# Patient Record
Sex: Female | Born: 1945 | Race: Black or African American | Hispanic: No | Smoking: Former smoker
Health system: Southern US, Community
[De-identification: ages and names within clinical notes are randomized; demographics above are authoritative.]

## PROBLEM LIST (undated history)

## (undated) DIAGNOSIS — L0390 Cellulitis, unspecified: Secondary | ICD-10-CM

## (undated) DIAGNOSIS — I739 Peripheral vascular disease, unspecified: Secondary | ICD-10-CM

## (undated) DIAGNOSIS — IMO0002 Reserved for concepts with insufficient information to code with codable children: Secondary | ICD-10-CM

## (undated) DIAGNOSIS — I10 Essential (primary) hypertension: Secondary | ICD-10-CM

## (undated) HISTORY — DX: Reserved for concepts with insufficient information to code with codable children: IMO0002

## (undated) HISTORY — DX: Cellulitis, unspecified: L03.90

## (undated) HISTORY — DX: Peripheral vascular disease, unspecified: I73.9

## (undated) HISTORY — DX: Essential (primary) hypertension: I10

---

## 2012-01-21 ENCOUNTER — Ambulatory Visit: Payer: Self-pay | Admitting: Family Medicine

## 2012-02-01 ENCOUNTER — Other Ambulatory Visit: Payer: Self-pay | Admitting: Internal Medicine

## 2012-02-01 DIAGNOSIS — M79672 Pain in left foot: Secondary | ICD-10-CM

## 2012-02-01 DIAGNOSIS — M25572 Pain in left ankle and joints of left foot: Secondary | ICD-10-CM

## 2012-02-04 ENCOUNTER — Other Ambulatory Visit: Payer: Self-pay

## 2012-02-05 ENCOUNTER — Ambulatory Visit
Admission: RE | Admit: 2012-02-05 | Discharge: 2012-02-05 | Disposition: A | Discharge status: Home / Self Care | Payer: Medicare Other | Source: Ambulatory Visit | Attending: Internal Medicine | Admitting: Internal Medicine

## 2012-02-05 DIAGNOSIS — M79672 Pain in left foot: Secondary | ICD-10-CM

## 2012-02-05 DIAGNOSIS — M25572 Pain in left ankle and joints of left foot: Secondary | ICD-10-CM

## 2014-02-11 ENCOUNTER — Other Ambulatory Visit: Payer: Self-pay | Admitting: *Deleted

## 2014-02-11 DIAGNOSIS — I739 Peripheral vascular disease, unspecified: Secondary | ICD-10-CM

## 2014-02-11 DIAGNOSIS — L98499 Non-pressure chronic ulcer of skin of other sites with unspecified severity: Principal | ICD-10-CM

## 2014-02-13 ENCOUNTER — Encounter: Payer: Self-pay | Admitting: Vascular Surgery

## 2014-03-08 ENCOUNTER — Encounter: Payer: Medicare Other | Admitting: Vascular Surgery

## 2014-03-08 ENCOUNTER — Encounter (HOSPITAL_COMMUNITY): Payer: Medicare Other

## 2015-04-15 DIAGNOSIS — M2550 Pain in unspecified joint: Secondary | ICD-10-CM | POA: Diagnosis not present

## 2015-04-15 DIAGNOSIS — Z79899 Other long term (current) drug therapy: Secondary | ICD-10-CM | POA: Diagnosis not present

## 2015-04-15 DIAGNOSIS — M0589 Other rheumatoid arthritis with rheumatoid factor of multiple sites: Secondary | ICD-10-CM | POA: Diagnosis not present

## 2015-04-15 DIAGNOSIS — Z9229 Personal history of other drug therapy: Secondary | ICD-10-CM | POA: Diagnosis not present

## 2015-05-19 DIAGNOSIS — Z87891 Personal history of nicotine dependence: Secondary | ICD-10-CM | POA: Diagnosis not present

## 2015-05-19 DIAGNOSIS — I10 Essential (primary) hypertension: Secondary | ICD-10-CM | POA: Diagnosis not present

## 2015-10-06 DIAGNOSIS — I10 Essential (primary) hypertension: Secondary | ICD-10-CM | POA: Diagnosis not present

## 2016-01-13 DIAGNOSIS — I10 Essential (primary) hypertension: Secondary | ICD-10-CM | POA: Diagnosis not present

## 2016-01-13 DIAGNOSIS — Z720 Tobacco use: Secondary | ICD-10-CM | POA: Diagnosis not present

## 2016-05-31 DIAGNOSIS — Z79899 Other long term (current) drug therapy: Secondary | ICD-10-CM | POA: Diagnosis not present

## 2016-05-31 DIAGNOSIS — M0589 Other rheumatoid arthritis with rheumatoid factor of multiple sites: Secondary | ICD-10-CM | POA: Diagnosis not present

## 2016-06-07 DIAGNOSIS — Z79899 Other long term (current) drug therapy: Secondary | ICD-10-CM | POA: Diagnosis not present

## 2016-06-07 DIAGNOSIS — Z720 Tobacco use: Secondary | ICD-10-CM | POA: Diagnosis not present

## 2016-06-07 DIAGNOSIS — I10 Essential (primary) hypertension: Secondary | ICD-10-CM | POA: Diagnosis not present

## 2016-06-30 DIAGNOSIS — M50321 Other cervical disc degeneration at C4-C5 level: Secondary | ICD-10-CM | POA: Diagnosis not present

## 2016-06-30 DIAGNOSIS — M189 Osteoarthritis of first carpometacarpal joint, unspecified: Secondary | ICD-10-CM | POA: Diagnosis not present

## 2016-06-30 DIAGNOSIS — M0589 Other rheumatoid arthritis with rheumatoid factor of multiple sites: Secondary | ICD-10-CM | POA: Diagnosis not present

## 2016-06-30 DIAGNOSIS — M19042 Primary osteoarthritis, left hand: Secondary | ICD-10-CM | POA: Diagnosis not present

## 2016-06-30 DIAGNOSIS — Z79899 Other long term (current) drug therapy: Secondary | ICD-10-CM | POA: Diagnosis not present

## 2017-09-02 ENCOUNTER — Other Ambulatory Visit: Payer: Self-pay | Admitting: Nurse Practitioner

## 2017-09-02 DIAGNOSIS — Z1231 Encounter for screening mammogram for malignant neoplasm of breast: Secondary | ICD-10-CM

## 2017-10-05 ENCOUNTER — Ambulatory Visit
Admission: RE | Admit: 2017-10-05 | Discharge: 2017-10-05 | Disposition: A | Discharge status: Home / Self Care | Payer: Medicare Other | Source: Ambulatory Visit | Attending: Nurse Practitioner | Admitting: Nurse Practitioner

## 2017-10-05 DIAGNOSIS — Z1231 Encounter for screening mammogram for malignant neoplasm of breast: Secondary | ICD-10-CM

## 2018-07-04 DIAGNOSIS — M069 Rheumatoid arthritis, unspecified: Secondary | ICD-10-CM

## 2018-07-04 DIAGNOSIS — R7309 Other abnormal glucose: Secondary | ICD-10-CM

## 2018-07-04 DIAGNOSIS — I10 Essential (primary) hypertension: Secondary | ICD-10-CM

## 2018-07-04 DIAGNOSIS — E782 Mixed hyperlipidemia: Secondary | ICD-10-CM

## 2018-07-04 DIAGNOSIS — R011 Cardiac murmur, unspecified: Secondary | ICD-10-CM

## 2018-07-14 ENCOUNTER — Telehealth: Payer: Self-pay | Admitting: *Deleted

## 2018-07-14 ENCOUNTER — Encounter: Payer: Self-pay | Admitting: *Deleted

## 2018-07-14 NOTE — Telephone Encounter (Signed)
Fax Referral placed in Scheduling box Notes placed in Chart Prep file cabinet. [facility], Arnette Felts, FNP-BC P# [phone] F# [phone]

## 2018-07-25 ENCOUNTER — Other Ambulatory Visit: Payer: Self-pay | Admitting: Nurse Practitioner

## 2018-08-16 ENCOUNTER — Other Ambulatory Visit: Payer: Self-pay | Admitting: Nurse Practitioner

## 2018-08-28 ENCOUNTER — Other Ambulatory Visit: Payer: Self-pay | Admitting: Nurse Practitioner

## 2018-08-28 DIAGNOSIS — Z1231 Encounter for screening mammogram for malignant neoplasm of breast: Secondary | ICD-10-CM

## 2018-09-08 ENCOUNTER — Ambulatory Visit (INDEPENDENT_AMBULATORY_CARE_PROVIDER_SITE_OTHER): Payer: Medicare Other | Admitting: Nurse Practitioner

## 2018-09-08 ENCOUNTER — Encounter: Payer: Self-pay | Admitting: Nurse Practitioner

## 2018-09-08 VITALS — BP 132/80 | HR 73 | Temp 97.8°F | Ht 62.0 in | Wt 111.6 lb

## 2018-09-08 DIAGNOSIS — Z0000 Encounter for general adult medical examination without abnormal findings: Secondary | ICD-10-CM

## 2018-09-08 DIAGNOSIS — Z1159 Encounter for screening for other viral diseases: Secondary | ICD-10-CM | POA: Diagnosis not present

## 2018-09-08 DIAGNOSIS — I10 Essential (primary) hypertension: Secondary | ICD-10-CM

## 2018-09-08 DIAGNOSIS — H6122 Impacted cerumen, left ear: Secondary | ICD-10-CM

## 2018-09-08 LAB — POCT URINALYSIS DIPSTICK
Bilirubin, UA: NEGATIVE
GLUCOSE UA: NEGATIVE
Ketones, UA: NEGATIVE
Nitrite, UA: NEGATIVE
PROTEIN UA: NEGATIVE
Spec Grav, UA: 1.01 (ref 1.010–1.025)
Urobilinogen, UA: 0.2 E.U./dL
pH, UA: 5.5 (ref 5.0–8.0)

## 2018-09-08 LAB — POCT UA - MICROALBUMIN
Albumin/Creatinine Ratio, Urine, POC: 300
Creatinine, POC: 50 mg/dL
MICROALBUMIN (UR) POC: 10 mg/L

## 2018-09-08 NOTE — Patient Instructions (Signed)
  Pamela Mathews , Thank you for taking time to come for your Medicare Wellness Visit. I appreciate your ongoing commitment to your health goals. Please review the following plan we discussed and let me know if I can assist you in the future.   These are the goals we discussed: Goals    . DIET - REDUCE FAT INTAKE     Decrease her fried foods        This is a list of the screening recommended for you and due dates:  Health Maintenance  Topic Date Due  .  Hepatitis C: One time screening is recommended by Center for Disease Control  (CDC) for  adults born from [birth year] through 1965.   04/24/1946  . Colon Cancer Screening  01/15/1996  . DEXA scan (bone density measurement)  01/15/2011  . Flu Shot  07/19/2019*  . Tetanus Vaccine  09/09/2019*  . Pneumonia vaccines (1 of 2 - PCV13) 09/09/2019*  . Mammogram  10/06/2019  *Topic was postponed. The date shown is not the original due date.

## 2018-09-08 NOTE — Progress Notes (Addendum)
Subjective:     Patient ID: Pamela Mathews , female    DOB: [birth year] , 72 y.o.   MRN: [id number]   Chief Complaint  Patient presents with  . Annual Exam    HPI  Hypertension  This is a chronic problem. The current episode started more than 1 year ago. The problem is unchanged. Pertinent negatives include no anxiety. Past treatments include nothing.     Past Medical History:  Diagnosis Date  . Cellulitis   . Hypertension   . Peripheral arterial disease (HCC)   . Ulcer      Family History  Problem Relation Age of Onset  . Breast cancer Neg Hx      Current Outpatient Medications:  .  ergocalciferol (VITAMIN D2) 50000 UNITS capsule, Take 50,000 Units by mouth once a week., Disp: , Rfl:  .  folic acid (FOLVITE) 1 MG tablet, Take 1 mg by mouth daily., Disp: , Rfl:  .  hydrochlorothiazide (HYDRODIURIL) 12.5 MG tablet, TAKE 1 TABLET BY MOUTH EVERY DAY, Disp: 90 tablet, Rfl: 0 .  methotrexate (RHEUMATREX) 2.5 MG tablet, Take 2.5 mg by mouth once a week. Caution:Chemotherapy. Protect from light., Disp: , Rfl:  .  metoprolol succinate (TOPROL-XL) 25 MG 24 hr tablet, TAKE 1 TABLET(25 MG) BY MOUTH EVERY DAY, Disp: 90 tablet, Rfl: 0   No Known Allergies   Review of Systems  Constitutional: Negative.   HENT: Negative.   Eyes: Negative.   Respiratory: Negative.   Cardiovascular: Negative.   Gastrointestinal: Negative.   Endocrine: Negative.   Genitourinary: Negative.   Musculoskeletal: Negative.   Skin: Negative.   Allergic/Immunologic: Negative.   Neurological: Negative.   Hematological: Negative.   Psychiatric/Behavioral: Negative.      Today's Vitals   09/08/18 0936  BP: 132/80  Pulse: 73  Temp: 97.8 F (36.6 C)  TempSrc: Oral  SpO2: 91%  Weight: 111 lb 9.6 oz (50.6 kg)  Height: 5\' 2"  (1.575 m)  PainSc: 0-No pain   Body mass index is 20.41 kg/m.   Objective:  Physical Exam Vitals signs reviewed.  Constitutional:      Appearance: She is  well-developed and well-nourished.  HENT:     Head: Normocephalic and atraumatic.     Right Ear: External ear normal.     Left Ear: There is impacted cerumen.     Nose: Nose normal.     Mouth/Throat:     Mouth: Oropharynx is clear and moist.  Eyes:     Extraocular Movements: EOM normal.     Conjunctiva/sclera: Conjunctivae normal.     Pupils: Pupils are equal, round, and reactive to light.  Neck:     Musculoskeletal: Normal range of motion and neck supple.     Thyroid: No thyromegaly.  Cardiovascular:     Rate and Rhythm: Normal rate and regular rhythm.     Heart sounds: Normal heart sounds. No murmur.  Pulmonary:     Effort: Pulmonary effort is normal. No respiratory distress.     Breath sounds: Normal breath sounds. No wheezing.  Abdominal:     General: Bowel sounds are normal.     Palpations: Abdomen is soft.     Tenderness: There is no abdominal tenderness.  Musculoskeletal: Normal range of motion.  Skin:    General: Skin is warm and dry.     Capillary Refill: Capillary refill takes less than 2 seconds.  Neurological:     Mental Status: She is alert and oriented to person, place, and  time.  Psychiatric:        Mood and Affect: Mood and affect normal.         Assessment And Plan:     1. Medicare annual wellness visit, subsequent  Pt's annual wellness exam was performed and geriatric assessment reviewed.   Pt has no new identiafble wellness concerns at this time.   WIll obtain routine labs.   Will obtain UA and micro.   Behavior modifications discussed and diet history reviewed. Pt will continue to exercise regularly and modify diet, with low GI, plant based foods and decrease food intake of processed foods.   Recommend intake of daily multivitamin, Vitamin D, and calcium.  Recommond mammogram and colonoscopy for preventive screenings, as well as recommend immunizations that include influenza (up to date) and TDAP  2. Essential hypertension  Chronic,  controlled  Continue with current medications - EKG 12-Lead - POCT Urinalysis Dipstick (81002) - POCT UA - Microalbumin  3. Impacted cerumen of left ear  Removed cerumen with lighted curette without difficulty  4. Encounter for hepatitis C screening test for low risk patient       Pamela Felts, FNP    Medicare Annual Wellness    Objective:    Today's Vitals   09/08/18 0936  BP: 132/80  Pulse: 73  Temp: 97.8 F (36.6 C)  TempSrc: Oral  SpO2: 91%  Weight: 111 lb 9.6 oz (50.6 kg)  Height: 5\' 2"  (1.575 m)  PainSc: 0-No pain   Body mass index is 20.41 kg/m.  Advanced Directives 09/08/2018  Does Patient Have a Medical Advance Directive? No  Would patient like information on creating a medical advance directive? Yes (MAU/Ambulatory/Procedural Areas - Information given)    Current Medications (verified) Outpatient Encounter Medications as of 09/08/2018  Medication Sig  . ergocalciferol (VITAMIN D2) 50000 UNITS capsule Take 50,000 Units by mouth once a week.  . folic acid (FOLVITE) 1 MG tablet Take 1 mg by mouth daily.  . hydrochlorothiazide (HYDRODIURIL) 12.5 MG tablet TAKE 1 TABLET BY MOUTH EVERY DAY  . methotrexate (RHEUMATREX) 2.5 MG tablet Take 2.5 mg by mouth once a week. Caution:Chemotherapy. Protect from light.  . metoprolol succinate (TOPROL-XL) 25 MG 24 hr tablet TAKE 1 TABLET(25 MG) BY MOUTH EVERY DAY  . [DISCONTINUED] hydrochlorothiazide (MICROZIDE) 12.5 MG capsule Take 12.5 mg by mouth daily.   No facility-administered encounter medications on file as of 09/08/2018.     Allergies (verified) Patient has no known allergies.   History: Past Medical History:  Diagnosis Date  . Cellulitis   . Hypertension   . Peripheral arterial disease (HCC)   . Ulcer    No past surgical history on file. Family History  Problem Relation Age of Onset  . Breast cancer Neg Hx    Social History   Socioeconomic History  . Marital status: Divorced    Spouse name:  Not on file  . Number of children: Not on file  . Years of education: Not on file  . Highest education level: Not on file  Occupational History  . Not on file  Social Needs  . Financial resource strain: Not on file  . Food insecurity:    Worry: Not on file    Inability: Not on file  . Transportation needs:    Medical: Not on file    Non-medical: Not on file  Tobacco Use  . Smoking status: Former Smoker    Last attempt to quit: 08/2017    Years since quitting: 1.0  .  Smokeless tobacco: Never Used  Substance and Sexual Activity  . Alcohol use: Never    Frequency: Never  . Drug use: Never  . Sexual activity: Not on file  Lifestyle  . Physical activity:    Days per week: Not on file    Minutes per session: Not on file  . Stress: Not on file  Relationships  . Social connections:    Talks on phone: Not on file    Gets together: Not on file    Attends religious service: Not on file    Active member of club or organization: Not on file    Attends meetings of clubs or organizations: Not on file    Relationship status: Not on file  Other Topics Concern  . Not on file  Social History Narrative  . Not on file    Tobacco Counseling Counseling given: Not Answered   Clinical Intake:    Pain Score: 0-No pain                  Activities of Daily Living In your present state of health, do you have any difficulty performing the following activities: 09/08/2018  Hearing? N  Vision? N  Difficulty concentrating or making decisions? N  Walking or climbing stairs? N  Dressing or bathing? N  Doing errands, shopping? N  Some recent data might be hidden    Immunizations and Health Maintenance  There is no immunization history on file for this patient. Health Maintenance Due  Topic Date Due  . Hepatitis C Screening  August 24, 1946  . COLONOSCOPY  01/15/1996  . DEXA SCAN  01/15/2011    Patient Care Team: Pamela Felts, FNP as PCP - General (General  Practice)  Indicate any recent Medical Services you may have received from other than [facility] providers in the past year (date may be approximate).     Assessment:   This is a routine wellness examination for [place].  Hearing/Vision screen No exam data present  Dietary issues and exercise activities discussed:    Goals    . DIET - REDUCE FAT INTAKE     Decrease her fried foods       Depression Screen PHQ 2/9 Scores 09/08/2018  PHQ - 2 Score 0    Fall Risk Fall Risk  09/08/2018  Falls in the past year? 0    Is the patient's home free of loose throw rugs in walkways, pet beds, electrical cords, etc?   yes      Grab bars in the bathroom? no      Handrails on the stairs?   yes      Adequate lighting?   yes  Timed Get Up and Go Performed less than 2 seconds  Cognitive Function:        Screening Tests Health Maintenance  Topic Date Due  . Hepatitis C Screening  [birth year]  . COLONOSCOPY  01/15/1996  . DEXA SCAN  01/15/2011  . INFLUENZA VACCINE  07/19/2019 (Originally 05/18/2018)  . TETANUS/TDAP  09/09/2019 (Originally 01/14/1965)  . PNA vac Low Risk Adult (1 of 2 - PCV13) 09/09/2019 (Originally 01/15/2011)  . MAMMOGRAM  10/06/2019    Qualifies for Shingles Vaccine? declines  Cancer Screenings: Lung: Low Dose CT Chest recommended if Age 68-80 years, 30 pack-year currently smoking OR have quit w/in 15years. Patient does not qualify. Breast: Up to date on Mammogram? Yes   Up to date of Bone Density/Dexa? No Colorectal: will do cologuard  Additional Screenings:  Hepatitis C Screening:  done today     Plan:     Pt's annual wellness exam was performed and geriatric assessment reviewed.   Pt has no new identiafble wellness concerns at this time.   WIll obtain routine labs.   Behavior modifications discussed and diet history reviewed. Pt will continue to exercise regularly and modify diet, with low GI, plant based foods and decrease food intake of processed foods.    Recommend intake of daily multivitamin, Vitamin D, and calcium.  Recommond mammogram and colonoscopy for preventive screenings, as well as recommend immunizations that include influenza (up to date) an TDAP (declines)   I have personally reviewed and noted the following in the patient's chart:   . Medical and social history . Use of alcohol, tobacco or illicit drugs  . Current medications and supplements . Functional ability and status . Nutritional status . Physical activity . Advanced directives . List of other physicians . Hospitalizations, surgeries, and ER visits in previous 12 months . Vitals . Screenings to include cognitive, depression, and falls . Referrals and appointments  In addition, I have reviewed and discussed with patient certain preventive protocols, quality metrics, and best practice recommendations. A written personalized care plan for preventive services as well as general preventive health recommendations were provided to patient.     Pamela Felts, FNP   09/08/2018

## 2018-10-13 ENCOUNTER — Ambulatory Visit
Admission: RE | Admit: 2018-10-13 | Discharge: 2018-10-13 | Disposition: A | Discharge status: Home / Self Care | Payer: Medicare Other | Source: Ambulatory Visit | Attending: Nurse Practitioner | Admitting: Nurse Practitioner

## 2018-10-13 DIAGNOSIS — Z1231 Encounter for screening mammogram for malignant neoplasm of breast: Secondary | ICD-10-CM

## 2018-12-08 ENCOUNTER — Ambulatory Visit (INDEPENDENT_AMBULATORY_CARE_PROVIDER_SITE_OTHER): Payer: Medicare Other | Admitting: Nurse Practitioner

## 2018-12-08 ENCOUNTER — Encounter: Payer: Self-pay | Admitting: Nurse Practitioner

## 2018-12-08 ENCOUNTER — Other Ambulatory Visit: Payer: Self-pay

## 2018-12-08 VITALS — BP 140/78 | HR 62 | Temp 98.1°F | Ht 62.2 in | Wt 111.4 lb

## 2018-12-08 DIAGNOSIS — M06042 Rheumatoid arthritis without rheumatoid factor, left hand: Secondary | ICD-10-CM | POA: Insufficient documentation

## 2018-12-08 DIAGNOSIS — I10 Essential (primary) hypertension: Secondary | ICD-10-CM

## 2018-12-08 DIAGNOSIS — E782 Mixed hyperlipidemia: Secondary | ICD-10-CM | POA: Insufficient documentation

## 2018-12-08 DIAGNOSIS — R7303 Prediabetes: Secondary | ICD-10-CM | POA: Insufficient documentation

## 2018-12-08 DIAGNOSIS — Z1159 Encounter for screening for other viral diseases: Secondary | ICD-10-CM

## 2018-12-08 NOTE — Progress Notes (Signed)
Subjective:     Patient ID: Pamela Mathews , female    DOB: [birth year] , 73 y.o.   MRN: [id number]   Chief Complaint  Patient presents with  . Hypertension    HPI  Hypertension  This is a chronic problem. The current episode started more than 1 year ago. The problem is controlled. Pertinent negatives include no anxiety, chest pain, headaches, palpitations or shortness of breath. There are no associated agents to hypertension. The current treatment provides no improvement. There are no compliance problems.  There is no history of angina. There is no history of chronic renal disease.     Past Medical History:  Diagnosis Date  . Cellulitis   . Hypertension   . Peripheral arterial disease ([place])   . Ulcer      Family History  Problem Relation Age of Onset  . Hypertension Mother   . Breast cancer Neg Hx      Current Outpatient Medications:  .  ergocalciferol (VITAMIN D2) 50000 UNITS capsule, Take 50,000 Units by mouth once a week., Disp: , Rfl:  .  folic acid (FOLVITE) 1 MG tablet, Take 1 mg by mouth daily., Disp: , Rfl:  .  hydrochlorothiazide (HYDRODIURIL) 12.5 MG tablet, TAKE 1 TABLET BY MOUTH EVERY DAY, Disp: 90 tablet, Rfl: 0 .  methotrexate (RHEUMATREX) 2.5 MG tablet, Take 2.5 mg by mouth once a week. Caution:Chemotherapy. Protect from light., Disp: , Rfl:  .  metoprolol succinate (TOPROL-XL) 25 MG 24 hr tablet, TAKE 1 TABLET(25 MG) BY MOUTH EVERY DAY, Disp: 90 tablet, Rfl: 0   No Known Allergies   Review of Systems  Constitutional: Negative for fever.  Respiratory: Negative.  Negative for cough and shortness of breath.   Cardiovascular: Negative for chest pain, palpitations and leg swelling.  Endocrine: Negative for polydipsia, polyphagia and polyuria.  Musculoskeletal: Positive for arthralgias (left hand pain).  Skin: Negative.   Neurological: Negative for dizziness and headaches.     Today's Vitals   12/08/18 1016  BP: 140/78  Pulse: 62  Temp: 98.1 F  (36.7 C)  TempSrc: Oral  SpO2: 96%  Weight: 111 lb 6.4 oz (50.5 kg)  Height: 5' 2.2" (1.58 m)  PainSc: 8   PainLoc: Hand   Body mass index is 20.24 kg/m.   Objective:  Physical Exam Constitutional:      Appearance: Normal appearance.  Cardiovascular:     Rate and Rhythm: Normal rate and regular rhythm.     Pulses: Normal pulses.     Heart sounds: Normal heart sounds. No murmur.  Pulmonary:     Effort: Pulmonary effort is normal. No respiratory distress.     Breath sounds: Normal breath sounds. No wheezing.  Neurological:     Mental Status: She is alert.  Psychiatric:        Mood and Affect: Mood normal.         Assessment And Plan:     1. Essential hypertension . B/P is controlled.  . CMP ordered to check renal function.  . The importance of regular exercise and dietary modification was stressed to the patient.  . Stressed importance of losing ten percent of her body weight to help with B/P control.  . The weight loss would help with decreasing cardiac and cancer risk as well.  - BMP8+eGFR - Hemoglobin A1c  2. Mixed hyperlipidemia  Chronic, controlled  No current medications  Discussed the importance of avoiding fried and fatty foods. - Lipid panel  3. Prediabetes  Chronic, controlled  No current medications  Encouraged to limit intake of sugary foods and drinks  Encouraged to increase physical activity to 150 minutes per week - BMP8+eGFR - Hemoglobin A1c  4. 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        Janece Moore, FNP  

## 2018-12-09 LAB — BMP8+EGFR
BUN / CREAT RATIO: 21 (ref 12–28)
BUN: 14 mg/dL (ref 8–27)
CO2: 23 mmol/L (ref 20–29)
Calcium: 9.8 mg/dL (ref 8.7–10.3)
Chloride: 95 mmol/L — ABNORMAL LOW (ref 96–106)
Creatinine, Ser: 0.67 mg/dL (ref 0.57–1.00)
GFR calc Af Amer: 102 mL/min/{1.73_m2} (ref >59)
GFR calc non Af Amer: 88 mL/min/{1.73_m2} (ref >59)
Glucose: 80 mg/dL (ref 65–99)
Potassium: 4.3 mmol/L (ref 3.5–5.2)
Sodium: 136 mmol/L (ref 134–144)

## 2018-12-09 LAB — HEPATITIS C ANTIBODY: Hep C Virus Ab: <0.1 s/co ratio (ref 0.0–0.9)

## 2018-12-09 LAB — LIPID PANEL
Chol/HDL Ratio: 3.9 ratio (ref 0.0–4.4)
Cholesterol, Total: 231 mg/dL — ABNORMAL HIGH (ref 100–199)
HDL: 59 mg/dL (ref >39)
LDL Calculated: 156 mg/dL — ABNORMAL HIGH (ref 0–99)
Triglycerides: 82 mg/dL (ref 0–149)
VLDL CHOLESTEROL CAL: 16 mg/dL (ref 5–40)

## 2018-12-09 LAB — HEMOGLOBIN A1C
ESTIMATED AVERAGE GLUCOSE: 120 mg/dL
Hgb A1c MFr Bld: 5.8 % — ABNORMAL HIGH (ref 4.8–5.6)

## 2018-12-15 ENCOUNTER — Other Ambulatory Visit: Payer: Self-pay | Admitting: Nurse Practitioner

## 2019-02-02 ENCOUNTER — Other Ambulatory Visit: Payer: Self-pay | Admitting: Nurse Practitioner

## 2019-03-15 ENCOUNTER — Other Ambulatory Visit: Payer: Self-pay | Admitting: Nurse Practitioner

## 2019-05-02 ENCOUNTER — Other Ambulatory Visit: Payer: Self-pay | Admitting: Nurse Practitioner

## 2019-05-27 ENCOUNTER — Other Ambulatory Visit: Payer: Self-pay | Admitting: Nurse Practitioner

## 2019-06-08 ENCOUNTER — Ambulatory Visit: Payer: Medicare Other | Admitting: Nurse Practitioner

## 2019-06-12 ENCOUNTER — Ambulatory Visit: Payer: Medicare Other | Admitting: Nurse Practitioner

## 2019-06-13 ENCOUNTER — Ambulatory Visit: Payer: Medicare Other | Admitting: Nurse Practitioner

## 2019-06-13 ENCOUNTER — Ambulatory Visit: Payer: Medicare Other

## 2019-07-03 ENCOUNTER — Other Ambulatory Visit: Payer: Self-pay

## 2019-07-03 ENCOUNTER — Encounter: Payer: Self-pay | Admitting: Nurse Practitioner

## 2019-07-03 ENCOUNTER — Ambulatory Visit (INDEPENDENT_AMBULATORY_CARE_PROVIDER_SITE_OTHER): Payer: Medicare Other

## 2019-07-03 ENCOUNTER — Ambulatory Visit (INDEPENDENT_AMBULATORY_CARE_PROVIDER_SITE_OTHER): Payer: Medicare Other | Admitting: Nurse Practitioner

## 2019-07-03 VITALS — BP 132/90 | HR 78 | Temp 98.2°F | Ht 62.25 in | Wt 111.0 lb

## 2019-07-03 VITALS — BP 132/90 | HR 78 | Temp 98.2°F | Ht 62.2 in | Wt 111.6 lb

## 2019-07-03 DIAGNOSIS — I10 Essential (primary) hypertension: Secondary | ICD-10-CM | POA: Diagnosis not present

## 2019-07-03 DIAGNOSIS — Z0000 Encounter for general adult medical examination without abnormal findings: Secondary | ICD-10-CM | POA: Diagnosis not present

## 2019-07-03 DIAGNOSIS — E782 Mixed hyperlipidemia: Secondary | ICD-10-CM

## 2019-07-03 DIAGNOSIS — R7303 Prediabetes: Secondary | ICD-10-CM | POA: Diagnosis not present

## 2019-07-03 NOTE — Patient Instructions (Signed)
Pamela Mathews , Thank you for taking time to come for your Medicare Wellness Visit. I appreciate your ongoing commitment to your health goals. Please review the following plan we discussed and let me know if I can assist you in the future.   Screening recommendations/referrals: Colonoscopy: declines Mammogram: 09/2018 Bone Density: scheduled by other MD Recommended yearly ophthalmology/optometry visit for glaucoma screening and checkup Recommended yearly dental visit for hygiene and checkup  Vaccinations: Influenza vaccine: declines Pneumococcal vaccine: declines Tdap vaccine: declines Shingles vaccine: discussed    Advanced directives: Advance directive discussed with you today. Even though you declined this today please call our office should you change your mind and we can give you the proper paperwork for you to fill out.   Conditions/risks identified: HTN  Next appointment: 09/20/2019 at 9:30   Preventive Care 73 Years and Older, Female Preventive care refers to lifestyle choices and visits with your health care provider that can promote health and wellness. What does preventive care include?  A yearly physical exam. This is also called an annual well check.  Dental exams once or twice a year.  Routine eye exams. Ask your health care provider how often you should have your eyes checked.  Personal lifestyle choices, including:  Daily care of your teeth and gums.  Regular physical activity.  Eating a healthy diet.  Avoiding tobacco and drug use.  Limiting alcohol use.  Practicing safe sex.  Taking low-dose aspirin every day.  Taking vitamin and mineral supplements as recommended by your health care provider. What happens during an annual well check? The services and screenings done by your health care provider during your annual well check will depend on your age, overall health, lifestyle risk factors, and family history of disease. Counseling  Your health care  provider may ask you questions about your:  Alcohol use.  Tobacco use.  Drug use.  Emotional well-being.  Home and relationship well-being.  Sexual activity.  Eating habits.  History of falls.  Memory and ability to understand (cognition).  Work and work Statistician.  Reproductive health. Screening  You may have the following tests or measurements:  Height, weight, and BMI.  Blood pressure.  Lipid and cholesterol levels. These may be checked every 5 years, or more frequently if you are over 59 years old.  Skin check.  Lung cancer screening. You may have this screening every year starting at age 70 if you have a 30-pack-year history of smoking and currently smoke or have quit within the past 15 years.  Fecal occult blood test (FOBT) of the stool. You may have this test every year starting at age 72.  Flexible sigmoidoscopy or colonoscopy. You may have a sigmoidoscopy every 5 years or a colonoscopy every 10 years starting at age 25.  Hepatitis C blood test.  Hepatitis B blood test.  Sexually transmitted disease (STD) testing.  Diabetes screening. This is done by checking your blood sugar (glucose) after you have not eaten for a while (fasting). You may have this done every 1-3 years.  Bone density scan. This is done to screen for osteoporosis. You may have this done starting at age 30.  Mammogram. This may be done every 1-2 years. Talk to your health care provider about how often you should have regular mammograms. Talk with your health care provider about your test results, treatment options, and if necessary, the need for more tests. Vaccines  Your health care provider may recommend certain vaccines, such as:  Influenza vaccine. This  is recommended every year.  Tetanus, diphtheria, and acellular pertussis (Tdap, Td) vaccine. You may need a Td booster every 10 years.  Zoster vaccine. You may need this after age 10.  Pneumococcal 13-valent conjugate (PCV13)  vaccine. One dose is recommended after age 54.  Pneumococcal polysaccharide (PPSV23) vaccine. One dose is recommended after age 44. Talk to your health care provider about which screenings and vaccines you need and how often you need them. This information is not intended to replace advice given to you by your health care provider. Make sure you discuss any questions you have with your health care provider. Document Released: 10/31/2015 Document Revised: 06/23/2016 Document Reviewed: 08/05/2015 Elsevier Interactive Patient Education  2017 [place] Prevention in the Home Falls can cause injuries. They can happen to people of all ages. There are many things you can do to make your home safe and to help prevent falls. What can I do on the outside of my home?  Regularly fix the edges of walkways and driveways and fix any cracks.  Remove anything that might make you trip as you walk through a door, such as a raised step or threshold.  Trim any bushes or trees on the path to your home.  Use bright outdoor lighting.  Clear any walking paths of anything that might make someone trip, such as rocks or tools.  Regularly check to see if handrails are loose or broken. Make sure that both sides of any steps have handrails.  Any raised decks and porches should have guardrails on the edges.  Have any leaves, snow, or ice cleared regularly.  Use sand or salt on walking paths during winter.  Clean up any spills in your garage right away. This includes oil or grease spills. What can I do in the bathroom?  Use night lights.  Install grab bars by the toilet and in the tub and shower. Do not use towel bars as grab bars.  Use non-skid mats or decals in the tub or shower.  If you need to sit down in the shower, use a plastic, non-slip stool.  Keep the floor dry. Clean up any water that spills on the floor as soon as it happens.  Remove soap buildup in the tub or shower regularly.   Attach bath mats securely with double-sided non-slip rug tape.  Do not have throw rugs and other things on the floor that can make you trip. What can I do in the bedroom?  Use night lights.  Make sure that you have a light by your bed that is easy to reach.  Do not use any sheets or blankets that are too big for your bed. They should not hang down onto the floor.  Have a firm chair that has side arms. You can use this for support while you get dressed.  Do not have throw rugs and other things on the floor that can make you trip. What can I do in the kitchen?  Clean up any spills right away.  Avoid walking on wet floors.  Keep items that you use a lot in easy-to-reach places.  If you need to reach something above you, use a strong step stool that has a grab bar.  Keep electrical cords out of the way.  Do not use floor polish or wax that makes floors slippery. If you must use wax, use non-skid floor wax.  Do not have throw rugs and other things on the floor that can make you  trip. What can I do with my stairs?  Do not leave any items on the stairs.  Make sure that there are handrails on both sides of the stairs and use them. Fix handrails that are broken or loose. Make sure that handrails are as long as the stairways.  Check any carpeting to make sure that it is firmly attached to the stairs. Fix any carpet that is loose or worn.  Avoid having throw rugs at the top or bottom of the stairs. If you do have throw rugs, attach them to the floor with carpet tape.  Make sure that you have a light switch at the top of the stairs and the bottom of the stairs. If you do not have them, ask someone to add them for you. What else can I do to help prevent falls?  Wear shoes that:  Do not have high heels.  Have rubber bottoms.  Are comfortable and fit you well.  Are closed at the toe. Do not wear sandals.  If you use a stepladder:  Make sure that it is fully opened. Do not climb  a closed stepladder.  Make sure that both sides of the stepladder are locked into place.  Ask someone to hold it for you, if possible.  Clearly mark and make sure that you can see:  Any grab bars or handrails.  First and last steps.  Where the edge of each step is.  Use tools that help you move around (mobility aids) if they are needed. These include:  Canes.  Walkers.  Scooters.  Crutches.  Turn on the lights when you go into a dark area. Replace any light bulbs as soon as they burn out.  Set up your furniture so you have a clear path. Avoid moving your furniture around.  If any of your floors are uneven, fix them.  If there are any pets around you, be aware of where they are.  Review your medicines with your doctor. Some medicines can make you feel dizzy. This can increase your chance of falling. Ask your doctor what other things that you can do to help prevent falls. This information is not intended to replace advice given to you by your health care provider. Make sure you discuss any questions you have with your health care provider. Document Released: 07/31/2009 Document Revised: 03/11/2016 Document Reviewed: 11/08/2014 Elsevier Interactive Patient Education  2017 [employer].

## 2019-07-03 NOTE — Progress Notes (Signed)
Subjective:     Patient ID: Pamela Mathews , female    DOB: [birth year] , 73 y.o.   MRN: [id number]   Chief Complaint  Patient presents with  . Hypertension    HPI  She has been drinking coffee and has not taken her medications today. She also has not eaten.    Hypertension This is a chronic problem. The current episode started more than 1 year ago. The problem is unchanged. The problem is controlled. Pertinent negatives include no anxiety or headaches. There are no associated agents to hypertension. Risk factors for coronary artery disease include sedentary lifestyle. Past treatments include diuretics and beta blockers. There are no compliance problems.  There is no history of angina. There is no history of chronic renal disease.     Past Medical History:  Diagnosis Date  . Cellulitis   . Hypertension   . Peripheral arterial disease ([place])   . Ulcer      Family History  Problem Relation Age of Onset  . Hypertension Mother   . Breast cancer Neg Hx      Current Outpatient Medications:  .  folic acid (FOLVITE) 1 MG tablet, Take 1 mg by mouth daily., Disp: , Rfl:  .  hydrochlorothiazide (HYDRODIURIL) 12.5 MG tablet, TAKE 1 TABLET BY MOUTH EVERY DAY, Disp: 90 tablet, Rfl: 0 .  methotrexate (RHEUMATREX) 2.5 MG tablet, Take 2.5 mg by mouth once a week. Caution:Chemotherapy. Protect from light., Disp: , Rfl:  .  metoprolol succinate (TOPROL-XL) 25 MG 24 hr tablet, TAKE 1 TABLET(25 MG) BY MOUTH EVERY DAY, Disp: 90 tablet, Rfl: 0 .  VITAMIN D PO, Take 1 tablet by mouth daily., Disp: , Rfl:    No Known Allergies   Review of Systems  Constitutional: Negative.   Respiratory: Negative.   Cardiovascular: Negative.   Neurological: Negative for dizziness and headaches.     Today's Vitals   07/03/19 1035  BP: 132/90  Pulse: 78  Temp: 98.2 F (36.8 C)  TempSrc: Oral  Weight: 111 lb 9.6 oz (50.6 kg)  Height: 5' 2.2" (1.58 m)  PainSc: 5   PainLoc: Hand   Body mass index is  20.28 kg/m.   Objective:  Physical Exam Vitals signs reviewed.  Constitutional:      General: She is not in acute distress.    Appearance: Normal appearance. She is well-developed.  HENT:     Head: Normocephalic and atraumatic.  Eyes:     Pupils: Pupils are equal, round, and reactive to light.  Cardiovascular:     Rate and Rhythm: Normal rate and regular rhythm.     Pulses: Normal pulses.     Heart sounds: Normal heart sounds. No murmur.  Pulmonary:     Effort: Pulmonary effort is normal. No respiratory distress.     Breath sounds: Normal breath sounds.  Musculoskeletal: Normal range of motion.  Skin:    General: Skin is warm and dry.     Capillary Refill: Capillary refill takes less than 2 seconds.  Neurological:     General: No focal deficit present.     Mental Status: She is alert and oriented to person, place, and time.     Cranial Nerves: No cranial nerve deficit.  Psychiatric:        Mood and Affect: Mood normal.        Behavior: Behavior normal.        Thought Content: Thought content normal.        Judgment: Judgment  normal.         Assessment And Plan:     1. Essential hypertension  Chronic,   Fair control of blood pressure  Continue with current medications  Encouraged to drink adequate water - CMP14 + Anion Gap  2. Mixed hyperlipidemia  Chronic, controlled  Continue with current medications - Lipid panel  3. Prediabetes  Chronic, stable  Continue avoiding foods high in sugar and starches - Hemoglobin A1c     Arnette Felts, FNP    THE PATIENT IS ENCOURAGED TO PRACTICE SOCIAL DISTANCING DUE TO THE COVID-19 PANDEMIC.

## 2019-07-03 NOTE — Progress Notes (Signed)
Subjective:   Pamela Mathews is a 73 y.o. female who presents for Medicare Annual (Subsequent) preventive examination.  Review of Systems:  n/a Cardiac Risk Factors include: advanced age (>83men, >75 women);dyslipidemia     Objective:     Vitals: BP 132/90 (BP Location: Right Arm, Patient Position: Sitting)   Pulse 78   Temp 98.2 F (36.8 C) (Oral)   Ht 5' 2.25" (1.581 m)   Wt 111 lb (50.3 kg)   BMI 20.14 kg/m   Body mass index is 20.14 kg/m.  Advanced Directives 07/03/2019 09/08/2018  Does Patient Have a Medical Advance Directive? No No  Would patient like information on creating a medical advance directive? - Yes (MAU/Ambulatory/Procedural Areas - Information given)    Tobacco Social History   Tobacco Use  Smoking Status Former Smoker  . Quit date: 08/2017  . Years since quitting: 1.8  Smokeless Tobacco Never Used     Counseling given: Not Answered   Clinical Intake:  Pre-visit preparation completed: Yes  Pain : 0-10 Pain Score: 5  Pain Type: Chronic pain Pain Location: Hand Pain Orientation: Left, Right Pain Descriptors / Indicators: Aching Pain Onset: More than a month ago Pain Frequency: Intermittent Pain Relieving Factors: methotraxate and tylenol  Pain Relieving Factors: methotraxate and tylenol  Nutritional Status: BMI of 19-24  Normal Nutritional Risks: None Diabetes: No  How often do you need to have someone help you when you read instructions, pamphlets, or other written materials from your doctor or pharmacy?: 1 - Never What is the last grade level you completed in school?: 11th grade  Interpreter Needed?: No  Information entered by :: NAllen LPN  Past Medical History:  Diagnosis Date  . Cellulitis   . Hypertension   . Peripheral arterial disease ([place])   . Ulcer    History reviewed. No pertinent surgical history. Family History  Problem Relation Age of Onset  . Hypertension Mother   . Breast cancer Neg Hx    Social  History   Socioeconomic History  . Marital status: Divorced    Spouse name: Not on file  . Number of children: Not on file  . Years of education: Not on file  . Highest education level: Not on file  Occupational History  . Occupation: retired  Scientific laboratory technician  . Financial resource strain: Not hard at all  . Food insecurity    Worry: Never true    Inability: Never true  . Transportation needs    Medical: No    Non-medical: No  Tobacco Use  . Smoking status: Former Smoker    Quit date: 08/2017    Years since quitting: 1.8  . Smokeless tobacco: Never Used  Substance and Sexual Activity  . Alcohol use: Not Currently    Frequency: Never  . Drug use: Never  . Sexual activity: Not Currently  Lifestyle  . Physical activity    Days per week: 7 days    Minutes per session: 60 min  . Stress: Not on file  Relationships  . Social Herbalist on phone: Not on file    Gets together: Not on file    Attends religious service: Not on file    Active member of club or organization: Not on file    Attends meetings of clubs or organizations: Not on file    Relationship status: Not on file  Other Topics Concern  . Not on file  Social History Narrative  . Not on file  Outpatient Encounter Medications as of 07/03/2019  Medication Sig  . folic acid (FOLVITE) 1 MG tablet Take 1 mg by mouth daily.  . hydrochlorothiazide (HYDRODIURIL) 12.5 MG tablet TAKE 1 TABLET BY MOUTH EVERY DAY  . methotrexate (RHEUMATREX) 2.5 MG tablet Take 2.5 mg by mouth once a week. Caution:Chemotherapy. Protect from light.  . metoprolol succinate (TOPROL-XL) 25 MG 24 hr tablet TAKE 1 TABLET(25 MG) BY MOUTH EVERY DAY  . VITAMIN D PO Take 1 tablet by mouth daily.   No facility-administered encounter medications on file as of 07/03/2019.     Activities of Daily Living In your present state of health, do you have any difficulty performing the following activities: 07/03/2019 09/08/2018  Hearing? N N  Vision?  N N  Difficulty concentrating or making decisions? N N  Walking or climbing stairs? N N  Dressing or bathing? N N  Doing errands, shopping? N N  Preparing Food and eating ? N -  Using the Toilet? N -  In the past six months, have you accidently leaked urine? N -  Do you have problems with loss of bowel control? N -  Managing your Medications? N -  Managing your Finances? N -  Housekeeping or managing your Housekeeping? N -  Some recent data might be hidden    Patient Care Team: Arnette Felts, FNP as PCP - General (General Practice)    Assessment:   This is a routine wellness examination for [place].  Exercise Activities and Dietary recommendations Current Exercise Habits: Home exercise routine, Type of exercise: walking, Time (Minutes): 60, Frequency (Times/Week): 7, Weekly Exercise (Minutes/Week): 420  Goals    . DIET - REDUCE FAT INTAKE     Decrease her fried foods     . Patient Stated     07/03/2019, eat healthier       Fall Risk Fall Risk  07/03/2019 07/03/2019 09/08/2018  Falls in the past year? 0 0 0  Risk for fall due to : Medication side effect - -  Follow up Falls evaluation completed;Education provided;Falls prevention discussed - -   Is the patient's home free of loose throw rugs in walkways, pet beds, electrical cords, etc?   yes      Grab bars in the bathroom? no      Handrails on the stairs?   n/a      Adequate lighting?   yes  Timed Get Up and Go performed: n/a  Depression Screen PHQ 2/9 Scores 07/03/2019 07/03/2019 09/08/2018  PHQ - 2 Score 0 0 0     Cognitive Function     6CIT Screen 07/03/2019  What Year? 0 points  What month? 0 points  What time? 0 points  Count back from 20 0 points  Months in reverse 2 points  Repeat phrase 0 points  Total Score 2     There is no immunization history on file for this patient.  Qualifies for Shingles Vaccine? yes  Screening Tests Health Maintenance  Topic Date Due  . COLONOSCOPY  01/15/1996  . DEXA  SCAN  01/15/2011  . TETANUS/TDAP  09/09/2019 (Originally 01/14/1965)  . PNA vac Low Risk Adult (1 of 2 - PCV13) 09/09/2019 (Originally 01/15/2011)  . INFLUENZA VACCINE  01/16/2020 (Originally 05/19/2019)  . MAMMOGRAM  10/13/2020  . Hepatitis C Screening  Completed    Cancer Screenings: Lung: Low Dose CT Chest recommended if Age 52-80 years, 30 pack-year currently smoking OR have quit w/in 15years. Patient does not qualify. Breast:  Up  to date on Mammogram? Yes   Up to date of Bone Density/Dexa? No Colorectal: declines  Additional Screenings: : Hepatitis C Screening: 11/2018     Plan:    Patient wants to eat healthier.   I have personally reviewed and noted the following in the patient's chart:   . Medical and social history . Use of alcohol, tobacco or illicit drugs  . Current medications and supplements . Functional ability and status . Nutritional status . Physical activity . Advanced directives . List of other physicians . Hospitalizations, surgeries, and ER visits in previous 12 months . Vitals . Screenings to include cognitive, depression, and falls . Referrals and appointments  In addition, I have reviewed and discussed with patient certain preventive protocols, quality metrics, and best practice recommendations. A written personalized care plan for preventive services as well as general preventive health recommendations were provided to patient.     Barb Merino, LPN  11/29/9415

## 2019-07-04 LAB — CMP14 + ANION GAP
ALT: 11 IU/L (ref 0–32)
AST: 28 IU/L (ref 0–40)
Albumin/Globulin Ratio: 1 — ABNORMAL LOW (ref 1.2–2.2)
Albumin: 4.1 g/dL (ref 3.7–4.7)
Alkaline Phosphatase: 130 IU/L — ABNORMAL HIGH (ref 39–117)
Anion Gap: 18 mmol/L (ref 10.0–18.0)
BUN/Creatinine Ratio: 19 (ref 12–28)
BUN: 16 mg/dL (ref 8–27)
Bilirubin Total: 0.2 mg/dL (ref 0.0–1.2)
CO2: 23 mmol/L (ref 20–29)
Calcium: 9.8 mg/dL (ref 8.7–10.3)
Chloride: 98 mmol/L (ref 96–106)
Creatinine, Ser: 0.83 mg/dL (ref 0.57–1.00)
GFR calc Af Amer: 81 mL/min/{1.73_m2} (ref >59)
GFR calc non Af Amer: 70 mL/min/{1.73_m2} (ref >59)
Globulin, Total: 4.1 g/dL (ref 1.5–4.5)
Glucose: 72 mg/dL (ref 65–99)
Potassium: 5.1 mmol/L (ref 3.5–5.2)
Sodium: 139 mmol/L (ref 134–144)
Total Protein: 8.2 g/dL (ref 6.0–8.5)

## 2019-07-04 LAB — LIPID PANEL
Chol/HDL Ratio: 3.9 ratio (ref 0.0–4.4)
Cholesterol, Total: 239 mg/dL — ABNORMAL HIGH (ref 100–199)
HDL: 62 mg/dL (ref >39)
LDL Chol Calc (NIH): 161 mg/dL — ABNORMAL HIGH (ref 0–99)
Triglycerides: 93 mg/dL (ref 0–149)
VLDL Cholesterol Cal: 16 mg/dL (ref 5–40)

## 2019-07-04 LAB — HEMOGLOBIN A1C
Est. average glucose Bld gHb Est-mCnc: 123 mg/dL
Hgb A1c MFr Bld: 5.9 % — ABNORMAL HIGH (ref 4.8–5.6)

## 2019-07-31 ENCOUNTER — Other Ambulatory Visit: Payer: Self-pay | Admitting: Nurse Practitioner

## 2019-08-25 ENCOUNTER — Other Ambulatory Visit: Payer: Self-pay | Admitting: Nurse Practitioner

## 2019-09-20 ENCOUNTER — Ambulatory Visit: Payer: Medicare Other

## 2019-09-20 ENCOUNTER — Encounter: Payer: Medicare Other | Admitting: Nurse Practitioner

## 2019-10-08 ENCOUNTER — Ambulatory Visit (INDEPENDENT_AMBULATORY_CARE_PROVIDER_SITE_OTHER): Payer: Medicare Other | Admitting: Nurse Practitioner

## 2019-10-08 ENCOUNTER — Encounter: Payer: Self-pay | Admitting: Nurse Practitioner

## 2019-10-08 ENCOUNTER — Other Ambulatory Visit: Payer: Self-pay

## 2019-10-08 VITALS — BP 122/70 | HR 87 | Temp 98.2°F | Ht 62.2 in | Wt 108.0 lb

## 2019-10-08 DIAGNOSIS — I10 Essential (primary) hypertension: Secondary | ICD-10-CM

## 2019-10-08 DIAGNOSIS — E782 Mixed hyperlipidemia: Secondary | ICD-10-CM

## 2019-10-08 DIAGNOSIS — M06042 Rheumatoid arthritis without rheumatoid factor, left hand: Secondary | ICD-10-CM | POA: Diagnosis not present

## 2019-10-08 DIAGNOSIS — R7303 Prediabetes: Secondary | ICD-10-CM

## 2019-10-08 DIAGNOSIS — Z23 Encounter for immunization: Secondary | ICD-10-CM

## 2019-10-08 MED ORDER — METOPROLOL SUCCINATE ER 25 MG PO TB24: 25.0000 mg | ORAL_TABLET | Freq: Every day | ORAL | 1 refills | Status: DC

## 2019-10-08 MED ORDER — HYDROCHLOROTHIAZIDE 12.5 MG PO TABS: 12.5000 mg | ORAL_TABLET | Freq: Every day | ORAL | 1 refills | Status: DC

## 2019-10-08 MED ORDER — TETANUS-DIPHTH-ACELL PERTUSSIS 5-2.5-18.5 LF-MCG/0.5 IM SUSP: 0.5000 mL | Freq: Once | INTRAMUSCULAR | 0 refills | Status: AC

## 2019-10-08 MED ORDER — PREVNAR 13 IM SUSP: 0.5000 mL | INTRAMUSCULAR | 0 refills | Status: AC

## 2019-10-08 NOTE — Progress Notes (Signed)
This visit occurred during the SARS-CoV-2 public health emergency.  Safety protocols were in place, including screening questions prior to the visit, additional usage of staff PPE, and extensive cleaning of exam room while observing appropriate contact time as indicated for disinfecting solutions.  Subjective:     Patient ID: Pamela Mathews , female    DOB: 08-05-1946 , 73 y.o.   MRN: [id number]   Chief Complaint  Patient presents with  . Hypertension    HPI  Hypertension This is a chronic problem. The current episode started more than 1 year ago. The problem is unchanged. The problem is controlled. Pertinent negatives include no anxiety, chest pain, headaches or palpitations. There are no associated agents to hypertension. Risk factors for coronary artery disease include sedentary lifestyle. Past treatments include diuretics and beta blockers. There are no compliance problems.  There is no history of angina. There is no history of chronic renal disease.     Past Medical History:  Diagnosis Date  . Cellulitis   . Hypertension   . Peripheral arterial disease ([place])   . Ulcer      Family History  Problem Relation Age of Onset  . Hypertension Mother   . Breast cancer Neg Hx      Current Outpatient Medications:  .  folic acid (FOLVITE) 1 MG tablet, Take 1 mg by mouth daily., Disp: , Rfl:  .  hydrochlorothiazide (HYDRODIURIL) 12.5 MG tablet, TAKE 1 TABLET BY MOUTH EVERY DAY, Disp: 90 tablet, Rfl: 0 .  methotrexate (RHEUMATREX) 2.5 MG tablet, Take 2.5 mg by mouth once a week. Caution:Chemotherapy. Protect from light., Disp: , Rfl:  .  metoprolol succinate (TOPROL-XL) 25 MG 24 hr tablet, TAKE 1 TABLET(25 MG) BY MOUTH EVERY DAY, Disp: 90 tablet, Rfl: 0 .  VITAMIN D PO, Take 1 tablet by mouth daily., Disp: , Rfl:    No Known Allergies   Review of Systems  Constitutional: Negative.   Respiratory: Negative.  Negative for cough.   Cardiovascular: Negative.  Negative for chest  pain, palpitations and leg swelling.  Endocrine: Negative for polydipsia, polyphagia and polyuria.  Neurological: Negative for dizziness and headaches.  Psychiatric/Behavioral: Negative.      Today's Vitals   10/08/19 0932  BP: 122/70  Pulse: 87  Temp: 98.2 F (36.8 C)  TempSrc: Oral  Weight: 108 lb (49 kg)  Height: 5' 2.2" (1.58 m)  PainSc: 0-No pain   Body mass index is 19.63 kg/m.   Objective:  Physical Exam Vitals reviewed.  Constitutional:      General: She is not in acute distress.    Appearance: Normal appearance.  Cardiovascular:     Rate and Rhythm: Normal rate and regular rhythm.     Pulses: Normal pulses.     Heart sounds: Normal heart sounds. No murmur.  Pulmonary:     Effort: Pulmonary effort is normal. No respiratory distress.     Breath sounds: Normal breath sounds.  Skin:    General: Skin is warm and dry.     Capillary Refill: Capillary refill takes less than 2 seconds.  Neurological:     General: No focal deficit present.     Mental Status: She is alert and oriented to person, place, and time.  Psychiatric:        Mood and Affect: Mood normal.        Behavior: Behavior normal.        Thought Content: Thought content normal.        Judgment:  Judgment normal.         Assessment And Plan:    1. Essential hypertension  Chronic, well controlled  Continue with current medications  Will check kidney functions - hydrochlorothiazide (HYDRODIURIL) 12.5 MG tablet; Take 1 tablet (12.5 mg total) by mouth daily.  Dispense: 90 tablet; Refill: 1 - metoprolol succinate (TOPROL-XL) 25 MG 24 hr tablet; Take 1 tablet (25 mg total) by mouth daily.  Dispense: 90 tablet; Refill: 1 - BMP8+eGFR  2. Mixed hyperlipidemia  Chronic, controlled  No current medications, diet controlled.  Will check lipid panel at next visit  3. Prediabetes  Chronic, well controlled for her age and HgbA1c is less than 7   Continue with diet controlled.  Will check HgbA1c at  next visit  4. Encounter for immunization  Sent both Rx's to pharmacy - pneumococcal 13-valent conjugate vaccine (PREVNAR 13) SUSP injection; Inject 0.5 mLs into the muscle tomorrow at 10 am for 1 dose.  Dispense: 0.5 mL; Refill: 0 - Tdap (BOOSTRIX) 5-2.5-18.5 LF-MCG/0.5 injection; Inject 0.5 mLs into the muscle once for 1 dose.  Dispense: 0.5 mL; Refill: 0  5. 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

## 2019-10-09 LAB — BMP8+EGFR
BUN/Creatinine Ratio: 18 (ref 12–28)
BUN: 13 mg/dL (ref 8–27)
CO2: 22 mmol/L (ref 20–29)
Calcium: 9.5 mg/dL (ref 8.7–10.3)
Chloride: 98 mmol/L (ref 96–106)
Creatinine, Ser: 0.74 mg/dL (ref 0.57–1.00)
GFR calc Af Amer: 93 mL/min/{1.73_m2} (ref >59)
GFR calc non Af Amer: 81 mL/min/{1.73_m2} (ref >59)
Glucose: 91 mg/dL (ref 65–99)
Potassium: 3.9 mmol/L (ref 3.5–5.2)
Sodium: 138 mmol/L (ref 134–144)

## 2020-01-10 DIAGNOSIS — M0589 Other rheumatoid arthritis with rheumatoid factor of multiple sites: Secondary | ICD-10-CM | POA: Diagnosis not present

## 2020-01-10 DIAGNOSIS — Z79899 Other long term (current) drug therapy: Secondary | ICD-10-CM | POA: Diagnosis not present

## 2020-01-10 DIAGNOSIS — M1990 Unspecified osteoarthritis, unspecified site: Secondary | ICD-10-CM | POA: Diagnosis not present

## 2020-01-10 DIAGNOSIS — M79644 Pain in right finger(s): Secondary | ICD-10-CM | POA: Diagnosis not present

## 2020-01-10 DIAGNOSIS — S82009A Unspecified fracture of unspecified patella, initial encounter for closed fracture: Secondary | ICD-10-CM | POA: Diagnosis not present

## 2020-01-10 DIAGNOSIS — M79645 Pain in left finger(s): Secondary | ICD-10-CM | POA: Diagnosis not present

## 2020-01-10 DIAGNOSIS — M8589 Other specified disorders of bone density and structure, multiple sites: Secondary | ICD-10-CM | POA: Diagnosis not present

## 2020-02-06 ENCOUNTER — Other Ambulatory Visit: Payer: Self-pay

## 2020-02-06 ENCOUNTER — Ambulatory Visit (INDEPENDENT_AMBULATORY_CARE_PROVIDER_SITE_OTHER): Payer: Medicare Other | Admitting: Nurse Practitioner

## 2020-02-06 VITALS — BP 122/76 | HR 83 | Temp 97.8°F

## 2020-02-06 DIAGNOSIS — E2839 Other primary ovarian failure: Secondary | ICD-10-CM

## 2020-02-06 DIAGNOSIS — M25562 Pain in left knee: Secondary | ICD-10-CM

## 2020-02-06 DIAGNOSIS — G8929 Other chronic pain: Secondary | ICD-10-CM

## 2020-02-06 DIAGNOSIS — M06042 Rheumatoid arthritis without rheumatoid factor, left hand: Secondary | ICD-10-CM

## 2020-02-06 DIAGNOSIS — I10 Essential (primary) hypertension: Secondary | ICD-10-CM

## 2020-02-06 DIAGNOSIS — E782 Mixed hyperlipidemia: Secondary | ICD-10-CM | POA: Diagnosis not present

## 2020-02-06 DIAGNOSIS — R7303 Prediabetes: Secondary | ICD-10-CM | POA: Diagnosis not present

## 2020-02-06 NOTE — Progress Notes (Signed)
This visit occurred during the SARS-CoV-2 public health emergency.  Safety protocols were in place, including screening questions prior to the visit, additional usage of staff PPE, and extensive cleaning of exam room while observing appropriate contact time as indicated for disinfecting solutions.  Subjective:     Patient ID: Pamela Mathews , female    DOB: 15-Mar-1946 , 74 y.o.   MRN: [id number]   Chief Complaint  Patient presents with  . Hypertension    HPI  Hypertension This is a chronic problem. The current episode started more than 1 year ago. The problem is unchanged. The problem is controlled. Pertinent negatives include no anxiety, chest pain, headaches or palpitations. There are no associated agents to hypertension. Risk factors for coronary artery disease include sedentary lifestyle. Past treatments include diuretics and beta blockers. There are no compliance problems.  There is no history of angina. There is no history of chronic renal disease.  Leg Pain  The incident occurred more than 1 week ago. Incident location: fell on her grandchild's toy in December. The injury mechanism was a fall. The pain is present in the left knee. Pertinent negatives include no inability to bear weight. Associated symptoms comments: Wearing knee brace and using a cane. Nothing aggravates the symptoms.     Past Medical History:  Diagnosis Date  . Cellulitis   . Hypertension   . Peripheral arterial disease ([place])   . Ulcer      Family History  Problem Relation Age of Onset  . Hypertension Mother   . Breast cancer Neg Hx      Current Outpatient Medications:  .  folic acid (FOLVITE) 1 MG tablet, Take 1 mg by mouth daily., Disp: , Rfl:  .  hydrochlorothiazide (HYDRODIURIL) 12.5 MG tablet, Take 1 tablet (12.5 mg total) by mouth daily., Disp: 90 tablet, Rfl: 1 .  methotrexate (RHEUMATREX) 2.5 MG tablet, Take 2.5 mg by mouth once a week. Caution:Chemotherapy. Protect from light., Disp: , Rfl:   .  metoprolol succinate (TOPROL-XL) 25 MG 24 hr tablet, Take 1 tablet (25 mg total) by mouth daily., Disp: 90 tablet, Rfl: 1 .  VITAMIN D PO, Take 1 tablet by mouth daily., Disp: , Rfl:    No Known Allergies   Review of Systems  Cardiovascular: Negative for chest pain and palpitations.  Neurological: Negative for headaches.     Today's Vitals   02/06/20 0939  BP: 122/76  Pulse: 83  Temp: 97.8 F (36.6 C)  TempSrc: Oral  SpO2: 96%   There is no height or weight on file to calculate BMI.   Objective:  Physical Exam Vitals reviewed.  Constitutional:      Appearance: She is well-developed.  HENT:     Head: Normocephalic and atraumatic.  Eyes:     Pupils: Pupils are equal, round, and reactive to light.  Cardiovascular:     Rate and Rhythm: Normal rate and regular rhythm.     Pulses: Normal pulses.     Heart sounds: Normal heart sounds. No murmur.  Pulmonary:     Effort: Pulmonary effort is normal.     Breath sounds: Normal breath sounds.  Musculoskeletal:        General: Swelling (left knee) and tenderness (left medial knee pain ) present. Normal range of motion.  Skin:    General: Skin is warm and dry.     Capillary Refill: Capillary refill takes less than 2 seconds.  Neurological:     General: No focal deficit present.  Mental Status: She is alert and oriented to person, place, and time.     Cranial Nerves: No cranial nerve deficit.  Psychiatric:        Mood and Affect: Mood normal.        Behavior: Behavior normal.        Thought Content: Thought content normal.        Judgment: Judgment normal.         Assessment And Plan:     1. Essential hypertension  Chronic, excellent control - BMP8+eGFR  2. Mixed hyperlipidemia  Chronic, stable.  Encouraged to continue to avoid fried and fatty foods - Lipid panel  3. Prediabetes  Chronic, stable  Will check HgbA1c - Hemoglobin A1c  4. Rheumatoid arthritis involving left hand with negative rheumatoid  factor (HCC)  Chronic, continue follow up with Rheumatology  5. Chronic pain of left knee  She reports having a fall on her grandchild's toy, left knee is swollen seen at orthopedics and the note in the chart mentions a fractured patella she does not want to have surgery.  Left knee is swollen and she does not bear weight   I have encouraged her to return to orthopedics to see if there is anything else they can do   [name], FNP    THE PATIENT IS ENCOURAGED TO PRACTICE SOCIAL DISTANCING DUE TO THE COVID-19 PANDEMIC.

## 2020-02-07 LAB — LIPID PANEL
Chol/HDL Ratio: 4 ratio (ref 0.0–4.4)
Cholesterol, Total: 238 mg/dL — ABNORMAL HIGH (ref 100–199)
HDL: 60 mg/dL (ref >39)
LDL Chol Calc (NIH): 162 mg/dL — ABNORMAL HIGH (ref 0–99)
Triglycerides: 91 mg/dL (ref 0–149)
VLDL Cholesterol Cal: 16 mg/dL (ref 5–40)

## 2020-02-07 LAB — BMP8+EGFR
BUN/Creatinine Ratio: 22 (ref 12–28)
BUN: 15 mg/dL (ref 8–27)
CO2: 25 mmol/L (ref 20–29)
Calcium: 9.8 mg/dL (ref 8.7–10.3)
Chloride: 99 mmol/L (ref 96–106)
Creatinine, Ser: 0.68 mg/dL (ref 0.57–1.00)
GFR calc Af Amer: 100 mL/min/{1.73_m2} (ref >59)
GFR calc non Af Amer: 86 mL/min/{1.73_m2} (ref >59)
Glucose: 92 mg/dL (ref 65–99)
Potassium: 4.5 mmol/L (ref 3.5–5.2)
Sodium: 139 mmol/L (ref 134–144)

## 2020-02-07 LAB — HEMOGLOBIN A1C
Est. average glucose Bld gHb Est-mCnc: 128 mg/dL
Hgb A1c MFr Bld: 6.1 % — ABNORMAL HIGH (ref 4.8–5.6)

## 2020-02-10 ENCOUNTER — Encounter: Payer: Self-pay | Admitting: Nurse Practitioner

## 2020-03-04 ENCOUNTER — Other Ambulatory Visit: Payer: Self-pay | Admitting: Nurse Practitioner

## 2020-03-04 DIAGNOSIS — I10 Essential (primary) hypertension: Secondary | ICD-10-CM

## 2020-04-22 DIAGNOSIS — M25562 Pain in left knee: Secondary | ICD-10-CM | POA: Diagnosis not present

## 2020-04-22 DIAGNOSIS — S82002D Unspecified fracture of left patella, subsequent encounter for closed fracture with routine healing: Secondary | ICD-10-CM | POA: Diagnosis not present

## 2020-04-22 DIAGNOSIS — Z79899 Other long term (current) drug therapy: Secondary | ICD-10-CM | POA: Diagnosis not present

## 2020-04-22 DIAGNOSIS — M79644 Pain in right finger(s): Secondary | ICD-10-CM | POA: Diagnosis not present

## 2020-04-22 DIAGNOSIS — M0589 Other rheumatoid arthritis with rheumatoid factor of multiple sites: Secondary | ICD-10-CM | POA: Diagnosis not present

## 2020-04-22 DIAGNOSIS — M1990 Unspecified osteoarthritis, unspecified site: Secondary | ICD-10-CM | POA: Diagnosis not present

## 2020-04-22 DIAGNOSIS — M79645 Pain in left finger(s): Secondary | ICD-10-CM | POA: Diagnosis not present

## 2020-05-16 IMAGING — MG DIGITAL SCREENING BILATERAL MAMMOGRAM WITH TOMO AND CAD
8 series · 9 of 24 positions shown · non-contrast
Comparison: Previous exam(s).

CLINICAL DATA: Screening.

EXAM:
DIGITAL SCREENING BILATERAL MAMMOGRAM WITH TOMO AND CAD

[L CC synth-2D]
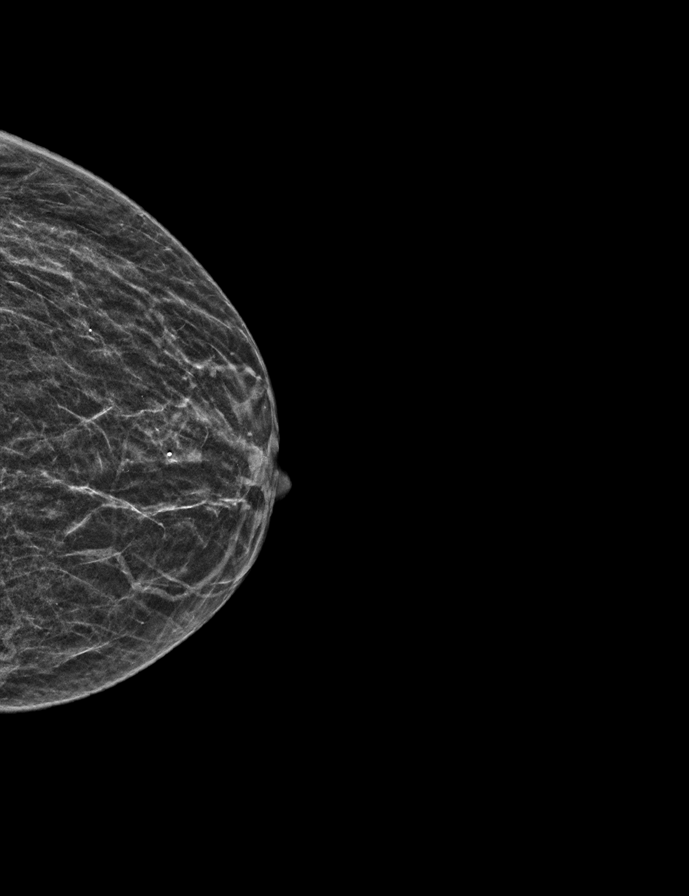

[L MLO synth-2D]
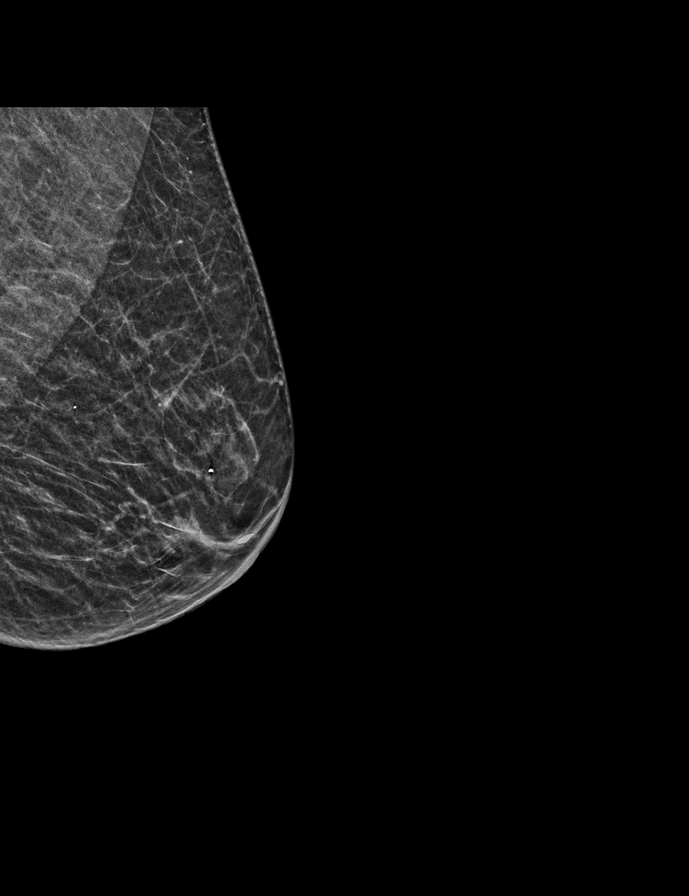

[R CC synth-2D]
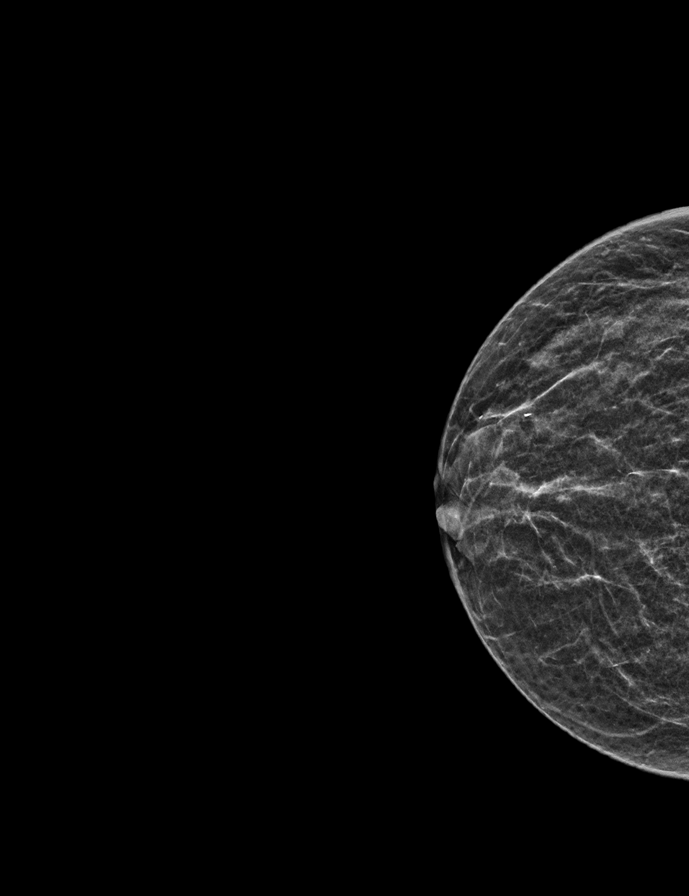

[R MLO synth-2D]
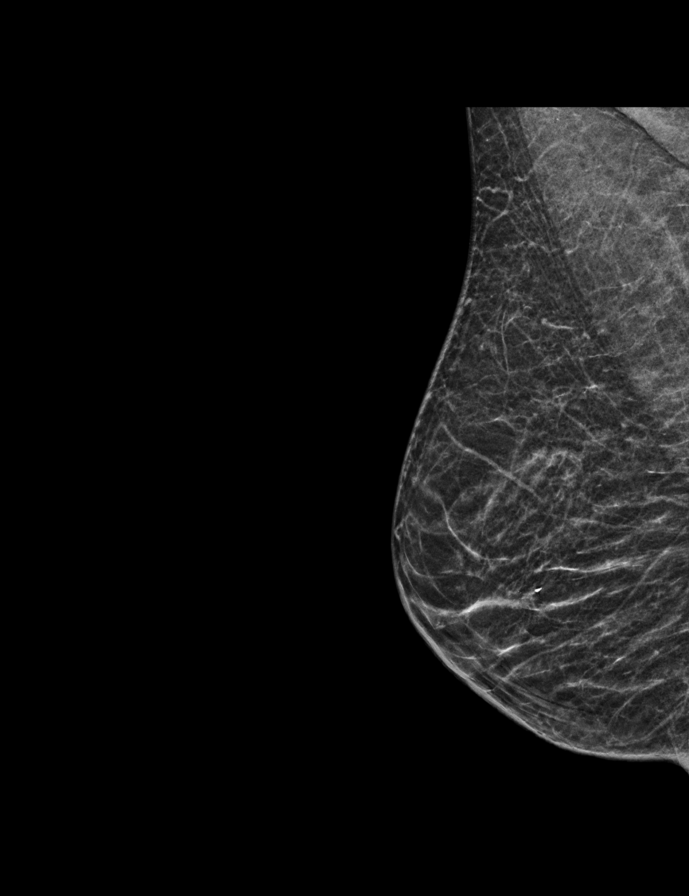

[R CC tomo · 2 of 28 frames shown]
[frame 10/28]
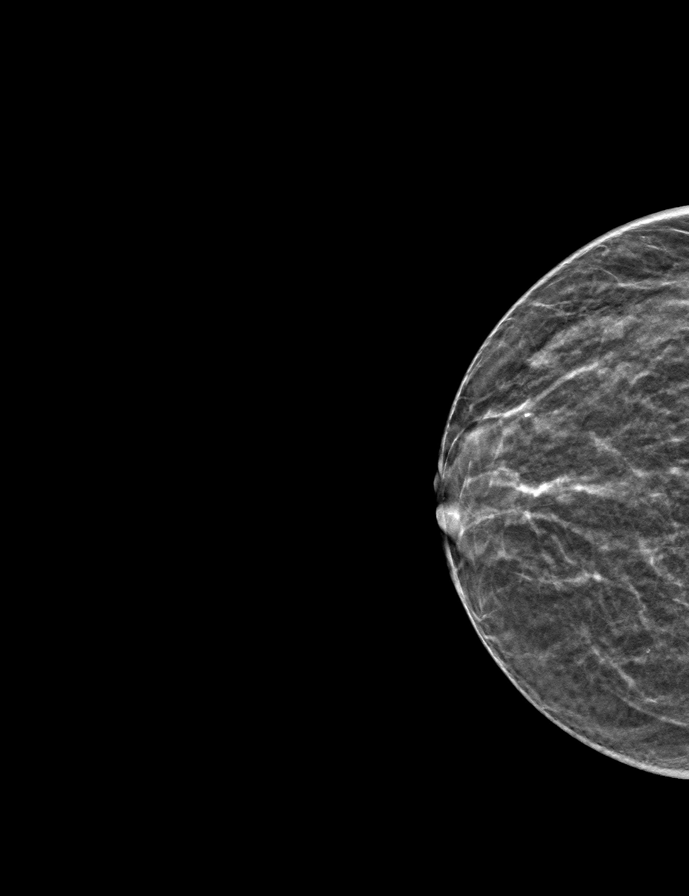
[frame 15/28]
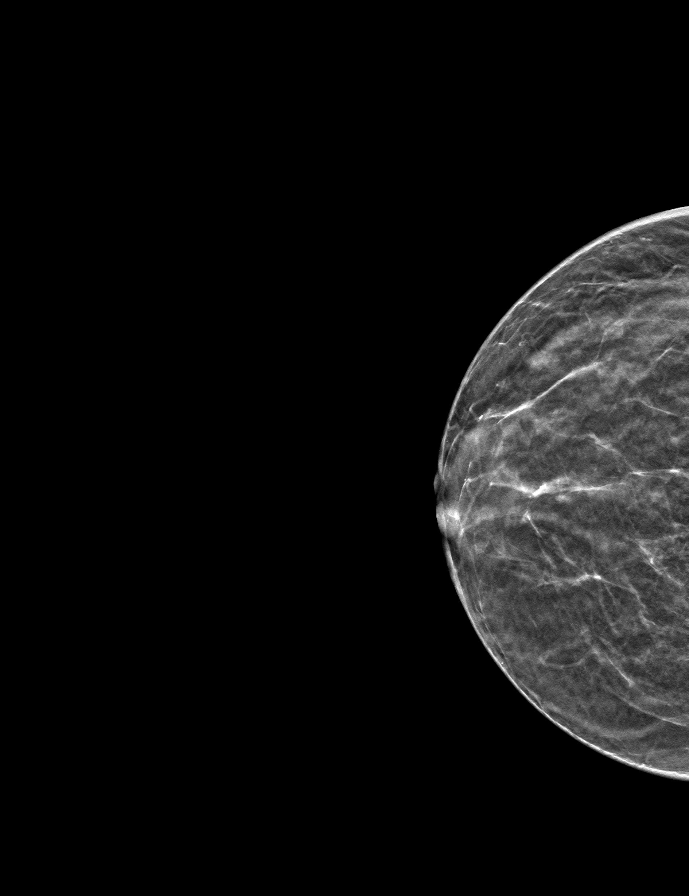

[L CC tomo · tomo slice 15/29.0]
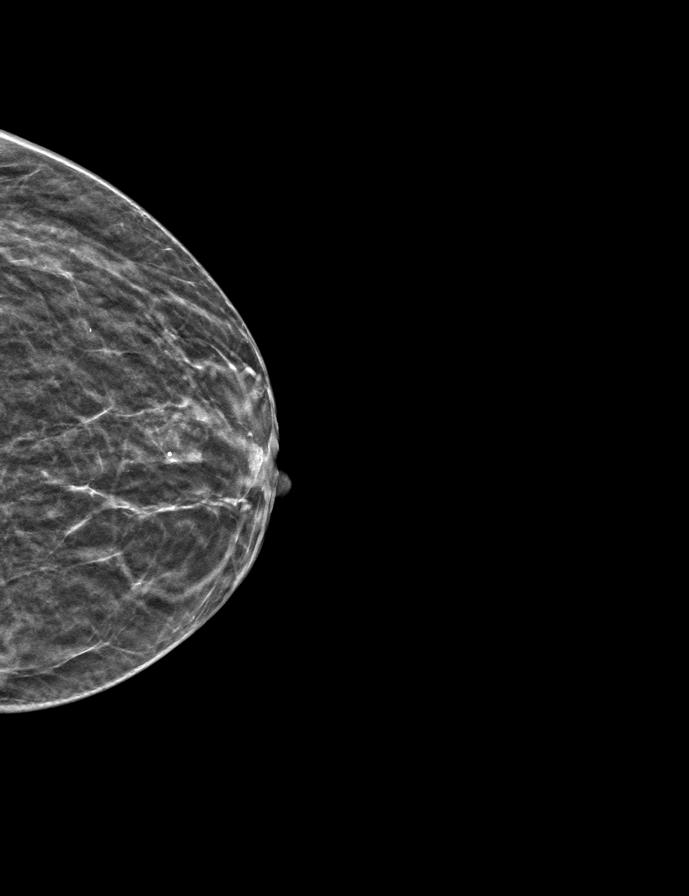

[L MLO tomo · tomo slice 16/31.0]
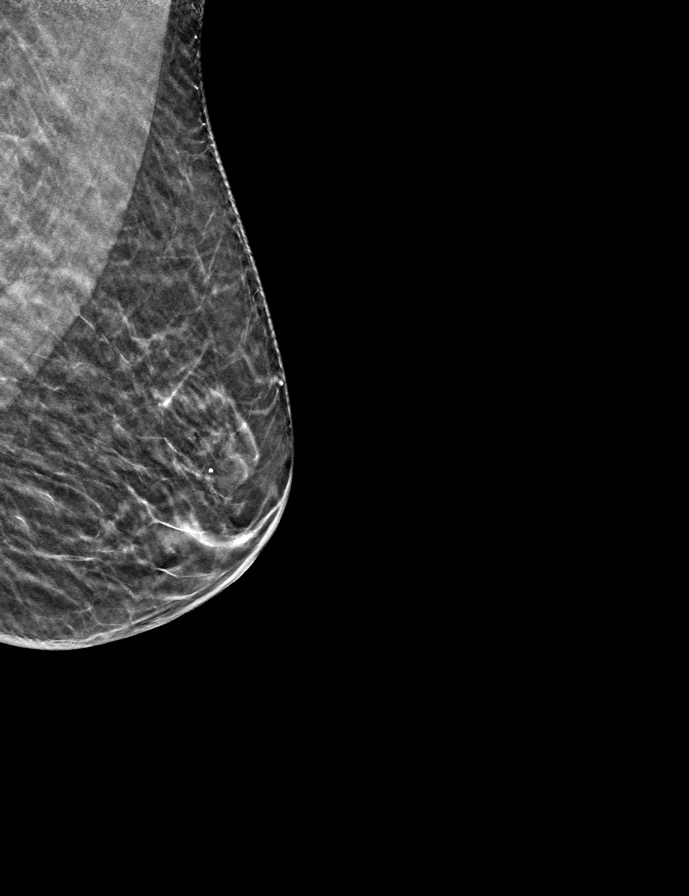

[R MLO tomo · tomo slice 15/30.0]
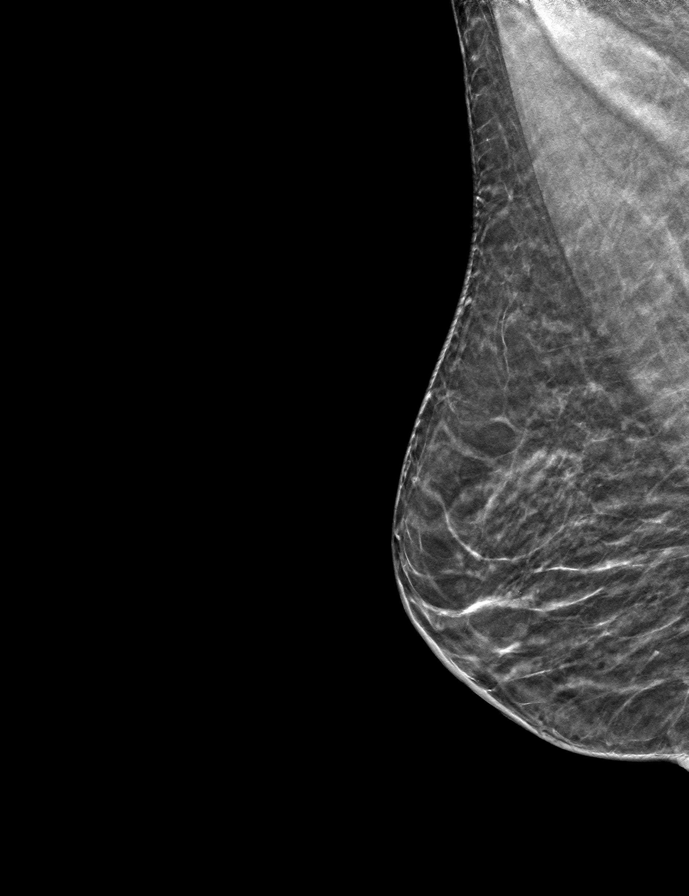

[9 of 24 positions shown; findings below may reference images not displayed]

ACR Breast Density Category b: There are scattered areas of
fibroglandular density.
FINDINGS: There are no findings suspicious for malignancy. Images were
processed with CAD.
IMPRESSION: No mammographic evidence of malignancy. A result letter of this
screening mammogram will be mailed directly to the patient.

RECOMMENDATION:
Screening mammogram in one year. (Code:[id number])

BI-RADS CATEGORY  1: Negative.

## 2020-06-17 ENCOUNTER — Other Ambulatory Visit: Payer: Self-pay | Admitting: Nurse Practitioner

## 2020-06-17 DIAGNOSIS — I10 Essential (primary) hypertension: Secondary | ICD-10-CM

## 2020-07-09 ENCOUNTER — Ambulatory Visit (INDEPENDENT_AMBULATORY_CARE_PROVIDER_SITE_OTHER): Payer: Medicare Other | Admitting: Nurse Practitioner

## 2020-07-09 ENCOUNTER — Other Ambulatory Visit: Payer: Self-pay

## 2020-07-09 ENCOUNTER — Encounter: Payer: Self-pay | Admitting: Nurse Practitioner

## 2020-07-09 ENCOUNTER — Ambulatory Visit (INDEPENDENT_AMBULATORY_CARE_PROVIDER_SITE_OTHER): Payer: Medicare Other

## 2020-07-09 VITALS — BP 110/76 | HR 76 | Temp 98.0°F | Ht 63.0 in | Wt 98.8 lb

## 2020-07-09 DIAGNOSIS — S8992XD Unspecified injury of left lower leg, subsequent encounter: Secondary | ICD-10-CM | POA: Diagnosis not present

## 2020-07-09 DIAGNOSIS — R7303 Prediabetes: Secondary | ICD-10-CM | POA: Diagnosis not present

## 2020-07-09 DIAGNOSIS — Z0000 Encounter for general adult medical examination without abnormal findings: Secondary | ICD-10-CM

## 2020-07-09 DIAGNOSIS — M06042 Rheumatoid arthritis without rheumatoid factor, left hand: Secondary | ICD-10-CM

## 2020-07-09 DIAGNOSIS — I10 Essential (primary) hypertension: Secondary | ICD-10-CM

## 2020-07-09 DIAGNOSIS — E782 Mixed hyperlipidemia: Secondary | ICD-10-CM | POA: Diagnosis not present

## 2020-07-09 NOTE — Patient Instructions (Signed)

## 2020-07-09 NOTE — Patient Instructions (Signed)
Pamela Mathews , Thank you for taking time to come for your Medicare Wellness Visit. I appreciate your ongoing commitment to your health goals. Please review the following plan we discussed and let me know if I can assist you in the future.   Screening recommendations/referrals: Colonoscopy: decline Mammogram: pt to schedule Bone Density: completed 01/10/2020 Recommended yearly ophthalmology/optometry visit for glaucoma screening and checkup Recommended yearly dental visit for hygiene and checkup  Vaccinations: Influenza vaccine: decline Pneumococcal vaccine: decline Tdap vaccine: decline Shingles vaccine: decline   Covid-19: decline  Advanced directives: Advance directive discussed with you today. Even though you declined this today please call our office should you change your mind and we can give you the proper paperwork for you to fill out.  Conditions/risks identified: none  Next appointment: Follow up in one year for your annual wellness visit    Preventive Care 65 Years and Older, Female Preventive care refers to lifestyle choices and visits with your health care provider that can promote health and wellness. What does preventive care include?  A yearly physical exam. This is also called an annual well check.  Dental exams once or twice a year.  Routine eye exams. Ask your health care provider how often you should have your eyes checked.  Personal lifestyle choices, including:  Daily care of your teeth and gums.  Regular physical activity.  Eating a healthy diet.  Avoiding tobacco and drug use.  Limiting alcohol use.  Practicing safe sex.  Taking low-dose aspirin every day.  Taking vitamin and mineral supplements as recommended by your health care provider. What happens during an annual well check? The services and screenings done by your health care provider during your annual well check will depend on your age, overall health, lifestyle risk factors, and family  history of disease. Counseling  Your health care provider may ask you questions about your:  Alcohol use.  Tobacco use.  Drug use.  Emotional well-being.  Home and relationship well-being.  Sexual activity.  Eating habits.  History of falls.  Memory and ability to understand (cognition).  Work and work Astronomer.  Reproductive health. Screening  You may have the following tests or measurements:  Height, weight, and BMI.  Blood pressure.  Lipid and cholesterol levels. These may be checked every 5 years, or more frequently if you are over 16 years old.  Skin check.  Lung cancer screening. You may have this screening every year starting at age 32 if you have a 30-pack-year history of smoking and currently smoke or have quit within the past 15 years.  Fecal occult blood test (FOBT) of the stool. You may have this test every year starting at age 15.  Flexible sigmoidoscopy or colonoscopy. You may have a sigmoidoscopy every 5 years or a colonoscopy every 10 years starting at age 77.  Hepatitis C blood test.  Hepatitis B blood test.  Sexually transmitted disease (STD) testing.  Diabetes screening. This is done by checking your blood sugar (glucose) after you have not eaten for a while (fasting). You may have this done every 1-3 years.  Bone density scan. This is done to screen for osteoporosis. You may have this done starting at age 41.  Mammogram. This may be done every 1-2 years. Talk to your health care provider about how often you should have regular mammograms. Talk with your health care provider about your test results, treatment options, and if necessary, the need for more tests. Vaccines  Your health care provider may  recommend certain vaccines, such as:  Influenza vaccine. This is recommended every year.  Tetanus, diphtheria, and acellular pertussis (Tdap, Td) vaccine. You may need a Td booster every 10 years.  Zoster vaccine. You may need this after  age 65.  Pneumococcal 13-valent conjugate (PCV13) vaccine. One dose is recommended after age 11.  Pneumococcal polysaccharide (PPSV23) vaccine. One dose is recommended after age 109. Talk to your health care provider about which screenings and vaccines you need and how often you need them. This information is not intended to replace advice given to you by your health care provider. Make sure you discuss any questions you have with your health care provider. Document Released: 10/31/2015 Document Revised: 06/23/2016 Document Reviewed: 08/05/2015 Elsevier Interactive Patient Education  2017 [place] Prevention in the Home Falls can cause injuries. They can happen to people of all ages. There are many things you can do to make your home safe and to help prevent falls. What can I do on the outside of my home?  Regularly fix the edges of walkways and driveways and fix any cracks.  Remove anything that might make you trip as you walk through a door, such as a raised step or threshold.  Trim any bushes or trees on the path to your home.  Use bright outdoor lighting.  Clear any walking paths of anything that might make someone trip, such as rocks or tools.  Regularly check to see if handrails are loose or broken. Make sure that both sides of any steps have handrails.  Any raised decks and porches should have guardrails on the edges.  Have any leaves, snow, or ice cleared regularly.  Use sand or salt on walking paths during winter.  Clean up any spills in your garage right away. This includes oil or grease spills. What can I do in the bathroom?  Use night lights.  Install grab bars by the toilet and in the tub and shower. Do not use towel bars as grab bars.  Use non-skid mats or decals in the tub or shower.  If you need to sit down in the shower, use a plastic, non-slip stool.  Keep the floor dry. Clean up any water that spills on the floor as soon as it  happens.  Remove soap buildup in the tub or shower regularly.  Attach bath mats securely with double-sided non-slip rug tape.  Do not have throw rugs and other things on the floor that can make you trip. What can I do in the bedroom?  Use night lights.  Make sure that you have a light by your bed that is easy to reach.  Do not use any sheets or blankets that are too big for your bed. They should not hang down onto the floor.  Have a firm chair that has side arms. You can use this for support while you get dressed.  Do not have throw rugs and other things on the floor that can make you trip. What can I do in the kitchen?  Clean up any spills right away.  Avoid walking on wet floors.  Keep items that you use a lot in easy-to-reach places.  If you need to reach something above you, use a strong step stool that has a grab bar.  Keep electrical cords out of the way.  Do not use floor polish or wax that makes floors slippery. If you must use wax, use non-skid floor wax.  Do not have throw rugs and  other things on the floor that can make you trip. What can I do with my stairs?  Do not leave any items on the stairs.  Make sure that there are handrails on both sides of the stairs and use them. Fix handrails that are broken or loose. Make sure that handrails are as long as the stairways.  Check any carpeting to make sure that it is firmly attached to the stairs. Fix any carpet that is loose or worn.  Avoid having throw rugs at the top or bottom of the stairs. If you do have throw rugs, attach them to the floor with carpet tape.  Make sure that you have a light switch at the top of the stairs and the bottom of the stairs. If you do not have them, ask someone to add them for you. What else can I do to help prevent falls?  Wear shoes that:  Do not have high heels.  Have rubber bottoms.  Are comfortable and fit you well.  Are closed at the toe. Do not wear sandals.  If you  use a stepladder:  Make sure that it is fully opened. Do not climb a closed stepladder.  Make sure that both sides of the stepladder are locked into place.  Ask someone to hold it for you, if possible.  Clearly mark and make sure that you can see:  Any grab bars or handrails.  First and last steps.  Where the edge of each step is.  Use tools that help you move around (mobility aids) if they are needed. These include:  Canes.  Walkers.  Scooters.  Crutches.  Turn on the lights when you go into a dark area. Replace any light bulbs as soon as they burn out.  Set up your furniture so you have a clear path. Avoid moving your furniture around.  If any of your floors are uneven, fix them.  If there are any pets around you, be aware of where they are.  Review your medicines with your doctor. Some medicines can make you feel dizzy. This can increase your chance of falling. Ask your doctor what other things that you can do to help prevent falls. This information is not intended to replace advice given to you by your health care provider. Make sure you discuss any questions you have with your health care provider. Document Released: 07/31/2009 Document Revised: 03/11/2016 Document Reviewed: 11/08/2014 Elsevier Interactive Patient Education  2017 [employer].

## 2020-07-09 NOTE — Progress Notes (Signed)
This visit occurred during the SARS-CoV-2 public health emergency.  Safety protocols were in place, including screening questions prior to the visit, additional usage of staff PPE, and extensive cleaning of exam room while observing appropriate contact time as indicated for disinfecting solutions.  Subjective:   Pamela Mathews is a 74 y.o. female who presents for Medicare Annual (Subsequent) preventive examination.  Review of Systems     Cardiac Risk Factors include: advanced age (>37men, >35 women);dyslipidemia;hypertension;sedentary lifestyle     Objective:    Today's Vitals   07/09/20 1005  BP: 110/76  Pulse: 76  Temp: 98 F (36.7 C)  TempSrc: Oral  SpO2: 98%  Weight: 98 lb 12.8 oz (44.8 kg)  Height: 5\' 3"  (1.6 m)   Body mass index is 17.5 kg/m.  Advanced Directives 07/09/2020 07/03/2019 09/08/2018  Does Patient Have a Medical Advance Directive? No No No  Would patient like information on creating a medical advance directive? No - Patient declined - Yes (MAU/Ambulatory/Procedural Areas - Information given)    Current Medications (verified) Outpatient Encounter Medications as of 07/09/2020  Medication Sig  . folic acid (FOLVITE) 1 MG tablet Take 1 mg by mouth daily.  . hydrochlorothiazide (HYDRODIURIL) 12.5 MG tablet TAKE 1 TABLET(12.5 MG) BY MOUTH DAILY  . methotrexate (RHEUMATREX) 2.5 MG tablet Take 2.5 mg by mouth once a week. Caution:Chemotherapy. Protect from light.  . metoprolol succinate (TOPROL-XL) 25 MG 24 hr tablet TAKE 1 TABLET(25 MG) BY MOUTH DAILY  . VITAMIN D PO Take 1 tablet by mouth daily.   No facility-administered encounter medications on file as of 07/09/2020.    Allergies (verified) Patient has no known allergies.   History: Past Medical History:  Diagnosis Date  . Cellulitis   . Hypertension   . Peripheral arterial disease (HCC)   . Ulcer    History reviewed. No pertinent surgical history. Family History  Problem Relation Age of Onset    . Hypertension Mother   . Breast cancer Neg Hx    Social History   Socioeconomic History  . Marital status: Divorced    Spouse name: Not on file  . Number of children: Not on file  . Years of education: Not on file  . Highest education level: Not on file  Occupational History  . Occupation: retired  Tobacco Use  . Smoking status: Former Smoker    Quit date: 08/2017    Years since quitting: 2.8  . Smokeless tobacco: Never Used  Vaping Use  . Vaping Use: Never used  Substance and Sexual Activity  . Alcohol use: Not Currently  . Drug use: Never  . Sexual activity: Not Currently  Other Topics Concern  . Not on file  Social History Narrative  . Not on file   Social Determinants of Health   Financial Resource Strain: Low Risk   . Difficulty of Paying Living Expenses: Not hard at all  Food Insecurity: No Food Insecurity  . Worried About 09/2017 in the Last Year: Never true  . Ran Out of Food in the Last Year: Never true  Transportation Needs: No Transportation Needs  . Lack of Transportation (Medical): No  . Lack of Transportation (Non-Medical): No  Physical Activity: Inactive  . Days of Exercise per Week: 0 days  . Minutes of Exercise per Session: 0 min  Stress: No Stress Concern Present  . Feeling of Stress : Not at all  Social Connections:   . Frequency of Communication with Friends and Family: Not  on file  . Frequency of Social Gatherings with Friends and Family: Not on file  . Attends Religious Services: Not on file  . Active Member of Clubs or Organizations: Not on file  . Attends Banker Meetings: Not on file  . Marital Status: Not on file    Tobacco Counseling Counseling given: Not Answered   Clinical Intake:  Pre-visit preparation completed: Yes  Pain : No/denies pain     Nutritional Status: BMI <19  Underweight Nutritional Risks: None Diabetes: No  How often do you need to have someone help you when you read  instructions, pamphlets, or other written materials from your doctor or pharmacy?: 1 - Never What is the last grade level you completed in school?: 11th grade  Diabetic? no  Interpreter Needed?: No  Information entered by :: NAllen LPN   Activities of Daily Living In your present state of health, do you have any difficulty performing the following activities: 07/09/2020  Hearing? N  Vision? N  Difficulty concentrating or making decisions? N  Walking or climbing stairs? Y  Dressing or bathing? N  Doing errands, shopping? N  Preparing Food and eating ? N  Using the Toilet? N  In the past six months, have you accidently leaked urine? N  Do you have problems with loss of bowel control? N  Managing your Medications? N  Managing your Finances? N  Housekeeping or managing your Housekeeping? N  Some recent data might be hidden    Patient Care Team: Arnette Felts, FNP as PCP - General (General Practice)  Indicate any recent Medical Services you may have received from other than [facility] providers in the past year (date may be approximate).     Assessment:   This is a routine wellness examination for [employer].  Hearing/Vision screen  Hearing Screening   125Hz  250Hz  500Hz  1000Hz  2000Hz  3000Hz  4000Hz  6000Hz  8000Hz   Right ear:           Left ear:           Vision Screening Comments: Every two years, [facility]  Dietary issues and exercise activities discussed: Current Exercise Habits: The patient does not participate in regular exercise at present  Goals    . DIET - REDUCE FAT INTAKE     Decrease her fried foods     . Patient Stated     07/03/2019, eat healthier    . Patient Stated     07/09/2020, no goals      Depression Screen PHQ 2/9 Scores 07/09/2020 10/08/2019 07/03/2019 07/03/2019 09/08/2018  PHQ - 2 Score 0 0 0 0 0    Fall Risk Fall Risk  07/09/2020 02/06/2020 10/08/2019 07/03/2019 07/03/2019  Falls in the past year? 1 1 0 0 0  Comment slipped on a toy - - - -  Number falls in  past yr: 0 0 - - -  Injury with Fall? 1 1 - - -  Comment fractured knee Fractured Knee - - -  Risk for fall due to : Impaired balance/gait;Impaired mobility;History of fall(s);Medication side effect - - Medication side effect -  Follow up Falls evaluation completed;Education provided;Falls prevention discussed - - Falls evaluation completed;Education provided;Falls prevention discussed -    Any stairs in or around the home? No  If so, are there any without handrails? n/a Home free of loose throw rugs in walkways, pet beds, electrical cords, etc? Yes  Adequate lighting in your home to reduce risk of falls? Yes   ASSISTIVE DEVICES UTILIZED  TO PREVENT FALLS:  Life alert? No  Use of a cane, walker or w/c? Yes  Grab bars in the bathroom? No  Shower chair or bench in shower? No  Elevated toilet seat or a handicapped toilet? No   TIMED UP AND GO:  Was the test performed? No .  Gait slow and steady with assistive device  Cognitive Function:     6CIT Screen 07/09/2020 07/03/2019  What Year? 0 points 0 points  What month? 0 points 0 points  What time? 0 points 0 points  Count back from 20 0 points 0 points  Months in reverse 0 points 2 points  Repeat phrase 2 points 0 points  Total Score 2 2    Immunizations  There is no immunization history on file for this patient.  TDAP status: Due, Education has been provided regarding the importance of this vaccine. Advised may receive this vaccine at local pharmacy or Health Dept. Aware to provide a copy of the vaccination record if obtained from local pharmacy or Health Dept. Verbalized acceptance and understanding. Flu Vaccine status: Declined, Education has been provided regarding the importance of this vaccine but patient still declined. Advised may receive this vaccine at local pharmacy or Health Dept. Aware to provide a copy of the vaccination record if obtained from local pharmacy or Health Dept. Verbalized acceptance and  understanding. Pneumococcal vaccine status: Declined,  Education has been provided regarding the importance of this vaccine but patient still declined. Advised may receive this vaccine at local pharmacy or Health Dept. Aware to provide a copy of the vaccination record if obtained from local pharmacy or Health Dept. Verbalized acceptance and understanding.  Covid-19 vaccine status: Declined, Education has been provided regarding the importance of this vaccine but patient still declined. Advised may receive this vaccine at local pharmacy or Health Dept.or vaccine clinic. Aware to provide a copy of the vaccination record if obtained from local pharmacy or Health Dept. Verbalized acceptance and understanding.  Qualifies for Shingles Vaccine? Yes   Zostavax completed No   Shingrix Completed?: No.    Education has been provided regarding the importance of this vaccine. Patient has been advised to call insurance company to determine out of pocket expense if they have not yet received this vaccine. Advised may also receive vaccine at local pharmacy or Health Dept. Verbalized acceptance and understanding.  Screening Tests Health Maintenance  Topic Date Due  . COVID-19 Vaccine (1) 07/25/2020 (Originally 01/14/1958)  . INFLUENZA VACCINE  01/15/2021 (Originally 05/18/2020)  . TETANUS/TDAP  02/05/2021 (Originally 01/14/1965)  . PNA vac Low Risk Adult (1 of 2 - PCV13) 02/05/2021 (Originally 01/15/2011)  . COLONOSCOPY  07/09/2021 (Originally 01/15/1996)  . MAMMOGRAM  10/13/2020  . DEXA SCAN  Completed  . Hepatitis C Screening  Completed    Health Maintenance  There are no preventive care reminders to display for this patient.  Colorectal cancer screening: declines Mammogram status: Completed 10/13/2018. Repeat every year Bone Density status: Completed 01/10/2020.   Lung Cancer Screening: (Low Dose CT Chest recommended if Age 29-80 years, 30 pack-year currently smoking OR have quit w/in 15years.) does not  qualify.   Lung Cancer Screening Referral: no  Additional Screening:  Hepatitis C Screening: does qualify; Completed 12/08/2018  Vision Screening: Recommended annual ophthalmology exams for early detection of glaucoma and other disorders of the eye. Is the patient up to date with their annual eye exam?  No  Who is the provider or what is the name of the office in  which the patient attends annual eye exams? [facility] If pt is not established with a provider, would they like to be referred to a provider to establish care? No .   Dental Screening: Recommended annual dental exams for proper oral hygiene  Community Resource Referral / Chronic Care Management: CRR required this visit?  No   CCM required this visit?  No      Plan:     I have personally reviewed and noted the following in the patient's chart:   . Medical and social history . Use of alcohol, tobacco or illicit drugs  . Current medications and supplements . Functional ability and status . Nutritional status . Physical activity . Advanced directives . List of other physicians . Hospitalizations, surgeries, and ER visits in previous 12 months . Vitals . Screenings to include cognitive, depression, and falls . Referrals and appointments  In addition, I have reviewed and discussed with patient certain preventive protocols, quality metrics, and best practice recommendations. A written personalized care plan for preventive services as well as general preventive health recommendations were provided to patient.     Barb Merino, LPN   [id number]   Nurse Notes:

## 2020-07-09 NOTE — Progress Notes (Signed)
I,Yamilka [employer] as a Education administrator for [employer], FNP.,have documented all relevant documentation on the behalf of [name], FNP,as directed by  [name], FNP while in the presence of [name], [place]. This visit occurred during the SARS-CoV-2 public health emergency.  Safety protocols were in place, including screening questions prior to the visit, additional usage of staff PPE, and extensive cleaning of exam room while observing appropriate contact time as indicated for disinfecting solutions.  Subjective:     Patient ID: Pamela Mathews , female    DOB: 1946/06/24 , 74 y.o.   MRN: [id number]   Chief Complaint  Patient presents with  . Hypertension    HPI  Wt Readings from Last 3 Encounters: 07/09/20 : 98 lb 12.3 oz (44.8 kg) 07/09/20 : 98 lb 12.8 oz (44.8 kg) 10/08/19 : 108 lb (49 kg)  She likes sweets mostly and will not eat a healthy meal.  She had her AWV with [facility] this morning.  Hypertension This is a chronic problem. The current episode started more than 1 year ago. The problem is unchanged. The problem is controlled. Pertinent negatives include no anxiety, chest pain, headaches or palpitations. There are no associated agents to hypertension. Risk factors for coronary artery disease include sedentary lifestyle. Past treatments include diuretics and beta blockers. There are no compliance problems.  There is no history of angina. There is no history of chronic renal disease.     Past Medical History:  Diagnosis Date  . Cellulitis   . Hypertension   . Peripheral arterial disease ([place])   . Ulcer      Family History  Problem Relation Age of Onset  . Hypertension Mother   . Breast cancer Neg Hx      Current Outpatient Medications:  .  folic acid (FOLVITE) 1 MG tablet, Take 1 mg by mouth daily., Disp: , Rfl:  .  hydrochlorothiazide (HYDRODIURIL) 12.5 MG tablet, TAKE 1 TABLET(12.5 MG) BY MOUTH DAILY, Disp: 90 tablet, Rfl: 1 .  methotrexate  (RHEUMATREX) 2.5 MG tablet, Take 2.5 mg by mouth once a week. Caution:Chemotherapy. Protect from light., Disp: , Rfl:  .  metoprolol succinate (TOPROL-XL) 25 MG 24 hr tablet, TAKE 1 TABLET(25 MG) BY MOUTH DAILY, Disp: 90 tablet, Rfl: 1 .  VITAMIN D PO, Take 1 tablet by mouth daily., Disp: , Rfl:    No Known Allergies   Review of Systems  Constitutional: Negative.   Respiratory: Negative.   Cardiovascular: Negative for chest pain, palpitations and leg swelling.  Neurological: Negative for headaches.  Psychiatric/Behavioral: Negative.      Today's Vitals   07/09/20 1022  BP: 110/76  Pulse: 76  Temp: 98 F (36.7 C)  TempSrc: Oral  Weight: 98 lb 12.3 oz (44.8 kg)  Height: 5' 3"  (1.6 m)  PainSc: 0-No pain   Body mass index is 17.5 kg/m.   Objective:  Physical Exam Vitals reviewed.  Constitutional:      Appearance: She is well-developed.  HENT:     Head: Normocephalic and atraumatic.  Eyes:     Pupils: Pupils are equal, round, and reactive to light.  Cardiovascular:     Rate and Rhythm: Normal rate and regular rhythm.     Pulses: Normal pulses.     Heart sounds: Normal heart sounds. No murmur heard.   Pulmonary:     Effort: Pulmonary effort is normal.     Breath sounds: Normal breath sounds.  Musculoskeletal:        General:  Swelling (left knee) and tenderness (left medial knee pain ) present. Normal range of motion.  Skin:    General: Skin is warm and dry.     Capillary Refill: Capillary refill takes less than 2 seconds.  Neurological:     General: No focal deficit present.     Mental Status: She is alert and oriented to person, place, and time.     Cranial Nerves: No cranial nerve deficit.  Psychiatric:        Mood and Affect: Mood normal.        Behavior: Behavior normal.        Thought Content: Thought content normal.        Judgment: Judgment normal.         Assessment And Plan:     1. Essential hypertension Chronic, well controlled Continue current  medications - BMP8+eGFR  2. Mixed hyperlipidemia Chronic, stable  No current medications Diet controlled - Lipid panel - BMP8+eGFR  3. Prediabetes  Chronic, stable  Continue with diet controlled - Hemoglobin A1c  4. 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 Injury of left knee, subsequent encounter  She had a fall several months ago and continues to limp  She declines going to orthopedics for further evaluation     Patient was given opportunity to ask questions. Patient verbalized understanding of the plan and was able to repeat key elements of the plan. All questions were answered to their satisfaction.   Teola Bradley, FNP, have reviewed all documentation for this visit. The documentation on 08/21/20 for the exam, diagnosis, procedures, and orders are all accurate and complete.  THE PATIENT IS ENCOURAGED TO PRACTICE SOCIAL DISTANCING DUE TO THE COVID-19 PANDEMIC.

## 2020-07-10 ENCOUNTER — Telehealth: Payer: Self-pay

## 2020-07-10 LAB — LIPID PANEL
Chol/HDL Ratio: 3.7 ratio (ref 0.0–4.4)
Cholesterol, Total: 236 mg/dL — ABNORMAL HIGH (ref 100–199)
HDL: 64 mg/dL (ref >39)
LDL Chol Calc (NIH): 155 mg/dL — ABNORMAL HIGH (ref 0–99)
Triglycerides: 95 mg/dL (ref 0–149)
VLDL Cholesterol Cal: 17 mg/dL (ref 5–40)

## 2020-07-10 LAB — BMP8+EGFR
BUN/Creatinine Ratio: 29 — ABNORMAL HIGH (ref 12–28)
BUN: 16 mg/dL (ref 8–27)
CO2: 25 mmol/L (ref 20–29)
Calcium: 9.7 mg/dL (ref 8.7–10.3)
Chloride: 99 mmol/L (ref 96–106)
Creatinine, Ser: 0.55 mg/dL — ABNORMAL LOW (ref 0.57–1.00)
GFR calc Af Amer: 107 mL/min/{1.73_m2} (ref >59)
GFR calc non Af Amer: 93 mL/min/{1.73_m2} (ref >59)
Glucose: 85 mg/dL (ref 65–99)
Potassium: 4.2 mmol/L (ref 3.5–5.2)
Sodium: 141 mmol/L (ref 134–144)

## 2020-07-10 LAB — HEMOGLOBIN A1C
Est. average glucose Bld gHb Est-mCnc: 123 mg/dL
Hgb A1c MFr Bld: 5.9 % — ABNORMAL HIGH (ref 4.8–5.6)

## 2020-07-10 NOTE — Telephone Encounter (Signed)
-----   Message from Arnette Felts, FNP sent at 07/10/2020  8:48 AM EDT ----- Kidney functions are normal.  HgbA1c is down to 5.9 from 6.1.  total cholesterol still elevated at 236 goal is less than 199 and LDL is 155 goal is less than 99, limit your intake of fried and fatty foods.

## 2020-07-10 NOTE — Telephone Encounter (Signed)
Attempted to give labs. Went straight to voicemail but vm not set up

## 2020-07-23 DIAGNOSIS — M79645 Pain in left finger(s): Secondary | ICD-10-CM | POA: Diagnosis not present

## 2020-07-23 DIAGNOSIS — M1990 Unspecified osteoarthritis, unspecified site: Secondary | ICD-10-CM | POA: Diagnosis not present

## 2020-07-23 DIAGNOSIS — Z79899 Other long term (current) drug therapy: Secondary | ICD-10-CM | POA: Diagnosis not present

## 2020-07-23 DIAGNOSIS — M0589 Other rheumatoid arthritis with rheumatoid factor of multiple sites: Secondary | ICD-10-CM | POA: Diagnosis not present

## 2020-07-23 DIAGNOSIS — M79644 Pain in right finger(s): Secondary | ICD-10-CM | POA: Diagnosis not present

## 2020-10-01 ENCOUNTER — Encounter: Payer: Medicare Other | Admitting: Nurse Practitioner

## 2020-10-30 ENCOUNTER — Other Ambulatory Visit: Payer: Self-pay | Admitting: Nurse Practitioner

## 2020-10-30 DIAGNOSIS — I10 Essential (primary) hypertension: Secondary | ICD-10-CM

## 2020-11-14 DIAGNOSIS — M25562 Pain in left knee: Secondary | ICD-10-CM | POA: Diagnosis not present

## 2020-11-14 DIAGNOSIS — M0589 Other rheumatoid arthritis with rheumatoid factor of multiple sites: Secondary | ICD-10-CM | POA: Diagnosis not present

## 2020-11-14 DIAGNOSIS — Z79899 Other long term (current) drug therapy: Secondary | ICD-10-CM | POA: Diagnosis not present

## 2020-11-14 DIAGNOSIS — R700 Elevated erythrocyte sedimentation rate: Secondary | ICD-10-CM | POA: Diagnosis not present

## 2020-11-14 DIAGNOSIS — M810 Age-related osteoporosis without current pathological fracture: Secondary | ICD-10-CM | POA: Diagnosis not present

## 2020-11-14 DIAGNOSIS — M79644 Pain in right finger(s): Secondary | ICD-10-CM | POA: Diagnosis not present

## 2020-11-14 DIAGNOSIS — M1990 Unspecified osteoarthritis, unspecified site: Secondary | ICD-10-CM | POA: Diagnosis not present

## 2020-11-17 ENCOUNTER — Other Ambulatory Visit: Payer: Self-pay

## 2020-11-17 ENCOUNTER — Ambulatory Visit (INDEPENDENT_AMBULATORY_CARE_PROVIDER_SITE_OTHER): Payer: Medicare Other | Admitting: Nurse Practitioner

## 2020-11-17 ENCOUNTER — Encounter: Payer: Self-pay | Admitting: Nurse Practitioner

## 2020-11-17 VITALS — BP 118/64 | HR 87 | Temp 98.2°F | Ht 61.2 in | Wt 99.2 lb

## 2020-11-17 DIAGNOSIS — Z0000 Encounter for general adult medical examination without abnormal findings: Secondary | ICD-10-CM | POA: Diagnosis not present

## 2020-11-17 DIAGNOSIS — E782 Mixed hyperlipidemia: Secondary | ICD-10-CM | POA: Diagnosis not present

## 2020-11-17 DIAGNOSIS — I10 Essential (primary) hypertension: Secondary | ICD-10-CM | POA: Diagnosis not present

## 2020-11-17 DIAGNOSIS — Z2821 Immunization not carried out because of patient refusal: Secondary | ICD-10-CM

## 2020-11-17 DIAGNOSIS — R7303 Prediabetes: Secondary | ICD-10-CM | POA: Diagnosis not present

## 2020-11-17 DIAGNOSIS — M06042 Rheumatoid arthritis without rheumatoid factor, left hand: Secondary | ICD-10-CM

## 2020-11-17 DIAGNOSIS — Z1231 Encounter for screening mammogram for malignant neoplasm of breast: Secondary | ICD-10-CM

## 2020-11-17 NOTE — Progress Notes (Signed)
Rutherford Nail as a scribe for [name], FNP.,have documented all relevant documentation on the behalf of [name], FNP,as directed by  [name], FNP while in the presence of [name], [place]. This visit occurred during the SARS-CoV-2 public health emergency.  Safety protocols were in place, including screening questions prior to the visit, additional usage of staff PPE, and extensive cleaning of exam room while observing appropriate contact time as indicated for disinfecting solutions.  Subjective:     Patient ID: Pamela Mathews , female    DOB: 30-May-1946 , 75 y.o.   MRN: [id number]   Chief Complaint  Patient presents with  . Annual Exam    HPI  Here for HM.  She is wearing shorts today and reports wears all year round, temperature outside is in low 30's.      Past Medical History:  Diagnosis Date  . Cellulitis   . Hypertension   . Peripheral arterial disease ([place])   . Ulcer      Family History  Problem Relation Age of Onset  . Hypertension Mother   . Breast cancer Neg Hx      Current Outpatient Medications:  .  folic acid (FOLVITE) 1 MG tablet, Take 1 mg by mouth daily., Disp: , Rfl:  .  hydrochlorothiazide (HYDRODIURIL) 12.5 MG tablet, TAKE 1 TABLET(12.5 MG) BY MOUTH DAILY, Disp: 90 tablet, Rfl: 1 .  methotrexate (RHEUMATREX) 2.5 MG tablet, Take 2.5 mg by mouth once a week. Caution:Chemotherapy. Protect from light., Disp: , Rfl:  .  metoprolol succinate (TOPROL-XL) 25 MG 24 hr tablet, TAKE 1 TABLET(25 MG) BY MOUTH DAILY, Disp: 90 tablet, Rfl: 1 .  VITAMIN D PO, Take 1 tablet by mouth daily., Disp: , Rfl:    No Known Allergies    The patient states she uses post menopausal status for birth control. Last LMP was No LMP recorded. Patient is postmenopausal.. Negative for Dysmenorrhea and Negative for Menorrhagia. Negative for: breast discharge, breast lump(s), breast pain and breast self exam. Associated symptoms include abnormal vaginal bleeding.  Pertinent negatives include abnormal bleeding (hematology), anxiety, decreased libido, depression, difficulty falling sleep, dyspareunia, history of infertility, nocturia, sexual dysfunction, sleep disturbances, urinary incontinence, urinary urgency, vaginal discharge and vaginal itching. Diet regular. The patient states her exercise level is  none.  The patient's tobacco use is:  Social History   Tobacco Use  Smoking Status Former Smoker  . Quit date: 08/2017  . Years since quitting: 3.2  Smokeless Tobacco Never Used  . She has been exposed to passive smoke. The patient's alcohol use is:  Social History   Substance and Sexual Activity  Alcohol Use Not Currently    Review of Systems  Constitutional: Negative.   HENT: Negative.   Eyes: Negative.   Respiratory: Negative.   Cardiovascular: Negative.   Gastrointestinal: Negative.   Endocrine: Negative.   Genitourinary: Negative.   Musculoskeletal: Negative.   Skin: Negative.   Allergic/Immunologic: Negative.   Neurological: Negative.   Hematological: Negative.   Psychiatric/Behavioral: Negative.      Today's Vitals   11/17/20 1456  BP: 118/64  Pulse: 87  Temp: 98.2 F (36.8 C)  Weight: 99 lb 3.2 oz (45 kg)  Height: 5' 1.2" (1.554 m)  PainSc: 0-No pain   Body mass index is 18.62 kg/m.   Objective:  Physical Exam Vitals reviewed.  Constitutional:      General: She is not in acute distress.    Appearance: Normal appearance. She is well-developed. She  is obese.  HENT:     Head: Normocephalic and atraumatic.     Right Ear: Hearing, tympanic membrane, ear canal and external ear normal. There is no impacted cerumen.     Left Ear: Hearing, tympanic membrane, ear canal and external ear normal. There is no impacted cerumen.     Nose:     Comments: Deferred - masked    Mouth/Throat:     Comments: Deferred - masked Eyes:     General: Lids are normal.     Extraocular Movements: Extraocular movements intact.      Conjunctiva/sclera: Conjunctivae normal.     Pupils: Pupils are equal, round, and reactive to light.     Funduscopic exam:    Right eye: No papilledema.        Left eye: No papilledema.  Neck:     Thyroid: No thyroid mass.     Vascular: No carotid bruit.  Cardiovascular:     Rate and Rhythm: Normal rate and regular rhythm.     Pulses: Normal pulses.     Heart sounds: Normal heart sounds. No murmur heard.   Pulmonary:     Effort: Pulmonary effort is normal. No respiratory distress.     Breath sounds: Normal breath sounds. No wheezing.  Chest:     Chest wall: No mass.  Breasts:     Tanner Score is 5.     Right: Normal. No mass, tenderness, axillary adenopathy or supraclavicular adenopathy.     Left: Normal. No mass, tenderness, axillary adenopathy or supraclavicular adenopathy.    Abdominal:     General: Abdomen is flat. Bowel sounds are normal. There is no distension.     Palpations: Abdomen is soft.     Tenderness: There is no abdominal tenderness.  Genitourinary:    Rectum: Guaiac result negative.  Musculoskeletal:        General: No swelling or tenderness. Normal range of motion.     Cervical back: Full passive range of motion without pain, normal range of motion and neck supple.     Right lower leg: No edema.     Left lower leg: No edema.     Comments: Left knee is swollen and contracted. She walks with a cane on her toes of her left foot  Lymphadenopathy:     Upper Body:     Right upper body: No supraclavicular, axillary or pectoral adenopathy.     Left upper body: No supraclavicular, axillary or pectoral adenopathy.  Skin:    General: Skin is warm and dry.     Capillary Refill: Capillary refill takes less than 2 seconds.  Neurological:     General: No focal deficit present.     Mental Status: She is alert and oriented to person, place, and time.     Cranial Nerves: No cranial nerve deficit.     Sensory: No sensory deficit.     Motor: No weakness.  Psychiatric:         Mood and Affect: Mood normal.        Behavior: Behavior normal.        Thought Content: Thought content normal.        Judgment: Judgment normal.         Assessment And Plan:     1. Health maintenance examination . Behavior modifications discussed and diet history reviewed.   . Pt will continue to exercise regularly and modify diet with low GI, plant based foods and decrease intake of processed foods.  Marland Kitchen  Recommend intake of daily multivitamin, Vitamin D, and calcium.  . Recommend mammogram and colonoscopy (declined) for preventive screenings, as well as recommend immunizations that include influenza, TDAP (declined all vaccines) - CBC  2. Essential hypertension . B/P is controlled.  . CMP ordered to check renal function.  . The importance of regular exercise and dietary modification was stressed to the patient.  . Stressed importance of losing ten percent of her body weight to help with B/P control.  . The weight loss would help with decreasing cardiac and cancer risk as well.  . EKG done with Sinus Rhythm, she has some artifact in her EKG right atrial enlargement  - POCT Urinalysis Dipstick (81002) - POCT UA - Microalbumin - EKG 12-Lead - CMP14+EGFR  3. Influenza vaccination declined Patient declined influenza vaccination at this time. Patient is aware that influenza vaccine prevents illness in 70% of healthy people, and reduces hospitalizations to 30-70% in elderly. This vaccine is recommended annually. Pt is willing to accept risk associated with refusing vaccination.  4. COVID-19 vaccination declined Declines covid 19 vaccine. Discussed risk of covid 48 and if she changes her mind about the vaccine to call the office.  Encouraged to take multivitamin, vitamin d, vitamin c and zinc to increase immune system. Aware can call office if would like to have vaccine here at office.   5. 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 Mixed hyperlipidemia  Chronic, stable  Diet controlled  - Lipid panel  7. Prediabetes  Chronic, stable  No current medications - CMP14+EGFR - Hemoglobin A1c  8. Encounter for screening mammogram for malignant neoplasm of breast Pt instructed on Self Breast Exam.According to ACOG guidelines Women aged 54 and older are recommended to get an annual mammogram. Order placed at [place] for appointment scheduing.  Pt encouraged to get annual mammogram - MM Digital Screening; Future     Patient was given opportunity to ask questions. Patient verbalized understanding of the plan and was able to repeat key elements of the plan. All questions were answered to their satisfaction.   [name], FNP   I, [name], FNP, have reviewed all documentation for this visit. The documentation on 11/17/20 for the exam, diagnosis, procedures, and orders are all accurate and complete.   THE PATIENT IS ENCOURAGED TO PRACTICE SOCIAL DISTANCING DUE TO THE COVID-19 PANDEMIC.

## 2020-11-17 NOTE — Patient Instructions (Signed)
Health Maintenance After Age 75 After age 75, you are at a higher risk for certain long-term diseases and infections as well as injuries from falls. Falls are a major cause of broken bones and head injuries in people who are older than age 75. Getting regular preventive care can help to keep you healthy and well. Preventive care includes getting regular testing and making lifestyle changes as recommended by your health care provider. Talk with your health care provider about:  Which screenings and tests you should have. A screening is a test that checks for a disease when you have no symptoms.  A diet and exercise plan that is right for you. What should I know about screenings and tests to prevent falls? Screening and testing are the best ways to find a health problem early. Early diagnosis and treatment give you the best chance of managing medical conditions that are common after age 75. Certain conditions and lifestyle choices may make you more likely to have a fall. Your health care provider may recommend:  Regular vision checks. Poor vision and conditions such as cataracts can make you more likely to have a fall. If you wear glasses, make sure to get your prescription updated if your vision changes.  Medicine review. Work with your health care provider to regularly review all of the medicines you are taking, including over-the-counter medicines. Ask your health care provider about any side effects that may make you more likely to have a fall. Tell your health care provider if any medicines that you take make you feel dizzy or sleepy.  Osteoporosis screening. Osteoporosis is a condition that causes the bones to get weaker. This can make the bones weak and cause them to break more easily.  Blood pressure screening. Blood pressure changes and medicines to control blood pressure can make you feel dizzy.  Strength and balance checks. Your health care provider may recommend certain tests to check your  strength and balance while standing, walking, or changing positions.  Foot health exam. Foot pain and numbness, as well as not wearing proper footwear, can make you more likely to have a fall.  Depression screening. You may be more likely to have a fall if you have a fear of falling, feel emotionally low, or feel unable to do activities that you used to do.  Alcohol use screening. Using too much alcohol can affect your balance and may make you more likely to have a fall. What actions can I take to lower my risk of falls? General instructions  Talk with your health care provider about your risks for falling. Tell your health care provider if: ? You fall. Be sure to tell your health care provider about all falls, even ones that seem minor. ? You feel dizzy, sleepy, or off-balance.  Take over-the-counter and prescription medicines only as told by your health care provider. These include any supplements.  Eat a healthy diet and maintain a healthy weight. A healthy diet includes low-fat dairy products, low-fat (lean) meats, and fiber from whole grains, beans, and lots of fruits and vegetables. Home safety  Remove any tripping hazards, such as rugs, cords, and clutter.  Install safety equipment such as grab bars in bathrooms and safety rails on stairs.  Keep rooms and walkways well-lit. Activity  Follow a regular exercise program to stay fit. This will help you maintain your balance. Ask your health care provider what types of exercise are appropriate for you.  If you need a cane or walker,   use it as recommended by your health care provider.  Wear supportive shoes that have nonskid soles.   Lifestyle  Do not drink alcohol if your health care provider tells you not to drink.  If you drink alcohol, limit how much you have: ? 0-1 drink a day for women. ? 0-2 drinks a day for men.  Be aware of how much alcohol is in your drink. In the [place] one drink equals one typical bottle of beer (12  oz), one-half glass of wine (5 oz), or one shot of hard liquor (1 oz).  Do not use any products that contain nicotine or tobacco, such as cigarettes and e-cigarettes. If you need help quitting, ask your health care provider. Summary  Having a healthy lifestyle and getting preventive care can help to protect your health and wellness after age 75.  Screening and testing are the best way to find a health problem early and help you avoid having a fall. Early diagnosis and treatment give you the best chance for managing medical conditions that are more common for people who are older than age 75.  Falls are a major cause of broken bones and head injuries in people who are older than age 75. Take precautions to prevent a fall at home.  Work with your health care provider to learn what changes you can make to improve your health and wellness and to prevent falls. This information is not intended to replace advice given to you by your health care provider. Make sure you discuss any questions you have with your health care provider. Document Revised: 01/25/2019 Document Reviewed: 08/17/2017 Elsevier Patient Education  2021 Elsevier Inc.  

## 2020-11-18 LAB — CBC
Hematocrit: 39 % (ref 34.0–46.6)
Hemoglobin: 12.5 g/dL (ref 11.1–15.9)
MCH: 26.1 pg — ABNORMAL LOW (ref 26.6–33.0)
MCHC: 32.1 g/dL (ref 31.5–35.7)
MCV: 81 fL (ref 79–97)
Platelets: 278 10*3/uL (ref 150–450)
RBC: 4.79 x10E6/uL (ref 3.77–5.28)
RDW: 15.4 % (ref 11.7–15.4)
WBC: 5.5 10*3/uL (ref 3.4–10.8)

## 2020-11-18 LAB — CMP14+EGFR
ALT: 11 IU/L (ref 0–32)
AST: 30 IU/L (ref 0–40)
Albumin/Globulin Ratio: 1 — ABNORMAL LOW (ref 1.2–2.2)
Albumin: 4.3 g/dL (ref 3.7–4.7)
Alkaline Phosphatase: 118 IU/L (ref 44–121)
BUN/Creatinine Ratio: 17 (ref 12–28)
BUN: 15 mg/dL (ref 8–27)
Bilirubin Total: 0.4 mg/dL (ref 0.0–1.2)
CO2: 24 mmol/L (ref 20–29)
Calcium: 8.4 mg/dL — ABNORMAL LOW (ref 8.7–10.3)
Chloride: 89 mmol/L — ABNORMAL LOW (ref 96–106)
Creatinine, Ser: 0.88 mg/dL (ref 0.57–1.00)
GFR calc Af Amer: 75 mL/min/{1.73_m2} (ref >59)
GFR calc non Af Amer: 65 mL/min/{1.73_m2} (ref >59)
Globulin, Total: 4.1 g/dL (ref 1.5–4.5)
Glucose: 80 mg/dL (ref 65–99)
Potassium: 3.8 mmol/L (ref 3.5–5.2)
Sodium: 132 mmol/L — ABNORMAL LOW (ref 134–144)
Total Protein: 8.4 g/dL (ref 6.0–8.5)

## 2020-11-18 LAB — LIPID PANEL
Chol/HDL Ratio: 4.3 ratio (ref 0.0–4.4)
Cholesterol, Total: 223 mg/dL — ABNORMAL HIGH (ref 100–199)
HDL: 52 mg/dL (ref >39)
LDL Chol Calc (NIH): 149 mg/dL — ABNORMAL HIGH (ref 0–99)
Triglycerides: 122 mg/dL (ref 0–149)
VLDL Cholesterol Cal: 22 mg/dL (ref 5–40)

## 2020-11-18 LAB — HEMOGLOBIN A1C
Est. average glucose Bld gHb Est-mCnc: 117 mg/dL
Hgb A1c MFr Bld: 5.7 % — ABNORMAL HIGH (ref 4.8–5.6)

## 2020-12-20 ENCOUNTER — Other Ambulatory Visit: Payer: Self-pay | Admitting: Nurse Practitioner

## 2020-12-20 DIAGNOSIS — I10 Essential (primary) hypertension: Secondary | ICD-10-CM

## 2021-03-17 ENCOUNTER — Ambulatory Visit: Payer: Medicare Other | Admitting: Nurse Practitioner

## 2021-03-24 DIAGNOSIS — M25562 Pain in left knee: Secondary | ICD-10-CM | POA: Diagnosis not present

## 2021-03-24 DIAGNOSIS — Z79899 Other long term (current) drug therapy: Secondary | ICD-10-CM | POA: Diagnosis not present

## 2021-03-24 DIAGNOSIS — M1990 Unspecified osteoarthritis, unspecified site: Secondary | ICD-10-CM | POA: Diagnosis not present

## 2021-03-24 DIAGNOSIS — M79644 Pain in right finger(s): Secondary | ICD-10-CM | POA: Diagnosis not present

## 2021-03-24 DIAGNOSIS — M0589 Other rheumatoid arthritis with rheumatoid factor of multiple sites: Secondary | ICD-10-CM | POA: Diagnosis not present

## 2021-03-24 DIAGNOSIS — M810 Age-related osteoporosis without current pathological fracture: Secondary | ICD-10-CM | POA: Diagnosis not present

## 2021-03-25 DIAGNOSIS — M0589 Other rheumatoid arthritis with rheumatoid factor of multiple sites: Secondary | ICD-10-CM | POA: Diagnosis not present

## 2021-03-30 ENCOUNTER — Ambulatory Visit (INDEPENDENT_AMBULATORY_CARE_PROVIDER_SITE_OTHER): Payer: Medicare Other | Admitting: Nurse Practitioner

## 2021-03-30 ENCOUNTER — Encounter: Payer: Self-pay | Admitting: Nurse Practitioner

## 2021-03-30 ENCOUNTER — Other Ambulatory Visit: Payer: Self-pay

## 2021-03-30 VITALS — BP 114/70 | HR 86 | Temp 99.2°F | Ht 60.0 in | Wt 101.8 lb

## 2021-03-30 DIAGNOSIS — E782 Mixed hyperlipidemia: Secondary | ICD-10-CM | POA: Diagnosis not present

## 2021-03-30 DIAGNOSIS — M06042 Rheumatoid arthritis without rheumatoid factor, left hand: Secondary | ICD-10-CM | POA: Diagnosis not present

## 2021-03-30 DIAGNOSIS — R7303 Prediabetes: Secondary | ICD-10-CM | POA: Diagnosis not present

## 2021-03-30 DIAGNOSIS — I10 Essential (primary) hypertension: Secondary | ICD-10-CM

## 2021-03-30 MED ORDER — HYDROCHLOROTHIAZIDE 12.5 MG PO TABS: ORAL_TABLET | ORAL | 1 refills | Status: DC

## 2021-03-30 MED ORDER — METOPROLOL SUCCINATE ER 25 MG PO TB24
ORAL_TABLET | ORAL | 1 refills | Status: DC
Start: 2021-03-30 — End: 2021-07-23

## 2021-03-30 NOTE — Patient Instructions (Signed)

## 2021-03-30 NOTE — Progress Notes (Deleted)
I,Salem Mastrogiovanni [employer] as a Education administrator for [employer], FNP.,have documented all relevant documentation on the behalf of [name], FNP,as directed by  [name], FNP while in the presence of [name], [place].  This visit occurred during the SARS-CoV-2 public health emergency.  Safety protocols were in place, including screening questions prior to the visit, additional usage of staff PPE, and extensive cleaning of exam room while observing appropriate contact time as indicated for disinfecting solutions.  Subjective:     Patient ID: Pamela Mathews , female    DOB: 1946-02-28 , 75 y.o.   MRN: [id number]   Chief Complaint  Patient presents with   Hypertension   Prediabetes    HPI  Patient presents today for a blood pressure and prediabetes.     Hypertension This is a chronic problem. The current episode started more than 1 year ago. The problem is unchanged. The problem is controlled. Pertinent negatives include no anxiety, chest pain, headaches or palpitations. There are no associated agents to hypertension. Risk factors for coronary artery disease include sedentary lifestyle. Past treatments include diuretics and beta blockers. There are no compliance problems.  There is no history of angina. There is no history of chronic renal disease.    Past Medical History:  Diagnosis Date   Cellulitis    Hypertension    Peripheral arterial disease ([place])    Ulcer      Family History  Problem Relation Age of Onset   Hypertension Mother    Breast cancer Neg Hx      Current Outpatient Medications:    folic acid (FOLVITE) 1 MG tablet, Take 1 mg by mouth daily., Disp: , Rfl:    methotrexate (RHEUMATREX) 2.5 MG tablet, Take 2.5 mg by mouth once a week. Caution:Chemotherapy. Protect from light., Disp: , Rfl:    VITAMIN D PO, Take 1 tablet by mouth daily., Disp: , Rfl:    hydrochlorothiazide (HYDRODIURIL) 12.5 MG tablet, TAKE 1 TABLET(12.5 MG) BY MOUTH DAILY, Disp: 90 tablet,  Rfl: 1   metoprolol succinate (TOPROL-XL) 25 MG 24 hr tablet, TAKE 1 TABLET(25 MG) BY MOUTH DAILY, Disp: 90 tablet, Rfl: 1   No Known Allergies   Review of Systems  Cardiovascular:  Negative for chest pain and palpitations.  Neurological:  Negative for headaches.    Today's Vitals   03/30/21 1619  BP: 114/70  Pulse: 86  Temp: 99.2 F (37.3 C)  Weight: 101 lb 12.8 oz (46.2 kg)  Height: 5' (1.524 m)  PainSc: 0-No pain   Body mass index is 19.88 kg/m.   Objective:  Physical Exam      Assessment And Plan:     1. Essential hypertension - hydrochlorothiazide (HYDRODIURIL) 12.5 MG tablet; TAKE 1 TABLET(12.5 MG) BY MOUTH DAILY  Dispense: 90 tablet; Refill: 1 - metoprolol succinate (TOPROL-XL) 25 MG 24 hr tablet; TAKE 1 TABLET(25 MG) BY MOUTH DAILY  Dispense: 90 tablet; Refill: 1 - CMP14+EGFR - Hemoglobin A1c  2. Prediabetes - Hemoglobin A1c  3. Mixed hyperlipidemia - Lipid panel   Patient refuses to have labs done here at the office today due to she feels that it hurts, offered to have someone else to draw labs.   Patient was given opportunity to ask questions. Patient verbalized understanding of the plan and was able to repeat key elements of the plan. All questions were answered to their satisfaction.  [name], FNP   I, [name], FNP, have reviewed all documentation for this visit. The documentation  on 03/30/21 for the exam, diagnosis, procedures, and orders are all accurate and complete.   IF YOU HAVE BEEN REFERRED TO A SPECIALIST, IT MAY TAKE 1-2 WEEKS TO SCHEDULE/PROCESS THE REFERRAL. IF YOU HAVE NOT HEARD FROM US/SPECIALIST IN TWO WEEKS, PLEASE GIVE [place] A CALL AT [phone] X [phone].   THE PATIENT IS ENCOURAGED TO PRACTICE SOCIAL DISTANCING DUE TO THE COVID-19 PANDEMIC.

## 2021-04-23 NOTE — Progress Notes (Signed)
I,Yamilka Roman [employer] as a Education administrator for [employer], FNP.,have documented all relevant documentation on the behalf of [name], FNP,as directed by  [name], FNP while in the presence of [name], [place].  This visit occurred during the SARS-CoV-2 public health emergency.  Safety protocols were in place, including screening questions prior to the visit, additional usage of staff PPE, and extensive cleaning of exam room while observing appropriate contact time as indicated for disinfecting solutions.  Subjective:     Patient ID: Pamela Mathews , female    DOB: [birth year] , 75 y.o.   MRN: [id number]   Chief Complaint  Patient presents with   Hypertension   Prediabetes    HPI  Patient presents today for a blood pressure and prediabetes.     Hypertension This is a chronic problem. The current episode started more than 1 year ago. The problem is unchanged. The problem is controlled. Pertinent negatives include no anxiety, chest pain, headaches or palpitations. There are no associated agents to hypertension. Risk factors for coronary artery disease include sedentary lifestyle. Past treatments include diuretics and beta blockers. There are no compliance problems.  There is no history of angina. There is no history of chronic renal disease.    Past Medical History:  Diagnosis Date   Cellulitis    Hypertension    Peripheral arterial disease ([place])    Ulcer      Family History  Problem Relation Age of Onset   Hypertension Mother    Breast cancer Neg Hx      Current Outpatient Medications:    folic acid (FOLVITE) 1 MG tablet, Take 1 mg by mouth daily., Disp: , Rfl:    methotrexate (RHEUMATREX) 2.5 MG tablet, Take 2.5 mg by mouth once a week. Caution:Chemotherapy. Protect from light., Disp: , Rfl:    VITAMIN D PO, Take 1 tablet by mouth daily., Disp: , Rfl:    hydrochlorothiazide (HYDRODIURIL) 12.5 MG tablet, TAKE 1 TABLET(12.5 MG) BY MOUTH DAILY, Disp: 90 tablet,  Rfl: 1   metoprolol succinate (TOPROL-XL) 25 MG 24 hr tablet, TAKE 1 TABLET(25 MG) BY MOUTH DAILY, Disp: 90 tablet, Rfl: 1   No Known Allergies   Review of Systems  Cardiovascular:  Negative for chest pain and palpitations.  Neurological:  Negative for headaches.    Today's Vitals   03/30/21 1619  BP: 114/70  Pulse: 86  Temp: 99.2 F (37.3 C)  Weight: 101 lb 12.8 oz (46.2 kg)  Height: 5' (1.524 m)  PainSc: 0-No pain   Body mass index is 19.88 kg/m.   Objective:  Physical Exam Vitals reviewed.  Constitutional:      General: She is not in acute distress.    Appearance: Normal appearance. She is well-developed and normal weight.  HENT:     Head: Normocephalic and atraumatic.  Eyes:     Pupils: Pupils are equal, round, and reactive to light.  Cardiovascular:     Rate and Rhythm: Normal rate and regular rhythm.     Pulses: Normal pulses.     Heart sounds: Normal heart sounds. No murmur heard. Pulmonary:     Effort: Pulmonary effort is normal. No respiratory distress.     Breath sounds: Normal breath sounds. No wheezing.  Musculoskeletal:        General: Swelling (left knee) present. No tenderness (left medial knee pain ). Normal range of motion.     Comments: Left knee is contracted  Skin:    General: Skin is warm  and dry.     Capillary Refill: Capillary refill takes less than 2 seconds.  Neurological:     General: No focal deficit present.     Mental Status: She is alert and oriented to person, place, and time.     Cranial Nerves: No cranial nerve deficit.     Motor: No weakness.  Psychiatric:        Mood and Affect: Mood normal.        Behavior: Behavior normal.        Thought Content: Thought content normal.        Judgment: Judgment normal.        Assessment And Plan:     1. Essential hypertension Chronic, well controlled Continue with current medications - hydrochlorothiazide (HYDRODIURIL) 12.5 MG tablet; TAKE 1 TABLET(12.5 MG) BY MOUTH DAILY  Dispense:  90 tablet; Refill: 1 - metoprolol succinate (TOPROL-XL) 25 MG 24 hr tablet; TAKE 1 TABLET(25 MG) BY MOUTH DAILY  Dispense: 90 tablet; Refill: 1 - CMP14+EGFR - Hemoglobin A1c  2. Prediabetes Chronic, stable Diet controlled - Hemoglobin A1c  3. Mixed hyperlipidemia Chronic, controlled No current medications, diet controlled - Lipid panel  4. 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   Patient was given opportunity to ask questions. Patient verbalized understanding of the plan and was able to repeat key elements of the plan. All questions were answered to their satisfaction.  [name], FNP   I, [name], FNP, have reviewed all documentation for this visit. The documentation on 03/30/21 for the exam, diagnosis, procedures, and orders are all accurate and complete.   IF YOU HAVE BEEN REFERRED TO A SPECIALIST, IT MAY TAKE 1-2 WEEKS TO SCHEDULE/PROCESS THE REFERRAL. IF YOU HAVE NOT HEARD FROM US/SPECIALIST IN TWO WEEKS, PLEASE GIVE [place] A CALL AT [phone].   THE PATIENT IS ENCOURAGED TO PRACTICE SOCIAL DISTANCING DUE TO THE COVID-19 PANDEMIC.

## 2021-04-24 ENCOUNTER — Inpatient Hospital Stay: Admission: RE | Admit: 2021-04-24 | Payer: Medicare Other | Source: Ambulatory Visit

## 2021-07-06 ENCOUNTER — Ambulatory Visit: Payer: Medicare Other | Admitting: Nurse Practitioner

## 2021-07-13 ENCOUNTER — Telehealth: Payer: Self-pay

## 2021-07-13 NOTE — Telephone Encounter (Signed)
Voicemail not setup.  I called the pt because she needed to reschedule her appt for this Wednesday for after 2:30 pm.  I was going to see if the pt remembered that it's a phone visit.

## 2021-07-15 ENCOUNTER — Ambulatory Visit: Payer: Medicare Other | Admitting: Nurse Practitioner

## 2021-07-15 ENCOUNTER — Telehealth: Payer: Self-pay | Admitting: Nurse Practitioner

## 2021-07-15 NOTE — Telephone Encounter (Signed)
Tried calling patient to schedule Medicare Annual Wellness Visit (AWV) either virtually or in office.    No answer   Last AWV 07/09/20  please schedule at anytime with [facility]    This should be a 45 minute visit.

## 2021-07-23 ENCOUNTER — Ambulatory Visit (INDEPENDENT_AMBULATORY_CARE_PROVIDER_SITE_OTHER): Payer: Medicare Other | Admitting: Nurse Practitioner

## 2021-07-23 ENCOUNTER — Other Ambulatory Visit: Payer: Self-pay

## 2021-07-23 ENCOUNTER — Ambulatory Visit (INDEPENDENT_AMBULATORY_CARE_PROVIDER_SITE_OTHER): Payer: Medicare Other

## 2021-07-23 VITALS — BP 102/60 | HR 87 | Temp 97.9°F | Ht 60.0 in | Wt 103.8 lb

## 2021-07-23 DIAGNOSIS — I10 Essential (primary) hypertension: Secondary | ICD-10-CM | POA: Diagnosis not present

## 2021-07-23 DIAGNOSIS — R7303 Prediabetes: Secondary | ICD-10-CM

## 2021-07-23 DIAGNOSIS — M06042 Rheumatoid arthritis without rheumatoid factor, left hand: Secondary | ICD-10-CM | POA: Diagnosis not present

## 2021-07-23 DIAGNOSIS — Z0000 Encounter for general adult medical examination without abnormal findings: Secondary | ICD-10-CM | POA: Diagnosis not present

## 2021-07-23 DIAGNOSIS — E782 Mixed hyperlipidemia: Secondary | ICD-10-CM

## 2021-07-23 MED ORDER — HYDROCHLOROTHIAZIDE 12.5 MG PO TABS: ORAL_TABLET | ORAL | 1 refills | Status: DC

## 2021-07-23 MED ORDER — METOPROLOL SUCCINATE ER 25 MG PO TB24: ORAL_TABLET | ORAL | 1 refills | Status: DC

## 2021-07-23 NOTE — Progress Notes (Signed)
I,Pamela Mathews,acting as a Education administrator for [employer], FNP.,have documented all relevant documentation on the behalf of Pamela Brine, FNP,as directed by  Pamela Brine, FNP while in the presence of Pamela Mathews, [place].   This visit occurred during the SARS-CoV-2 public health emergency.  Safety protocols were in place, including screening questions prior to the visit, additional usage of staff PPE, and extensive cleaning of exam room while observing appropriate contact time as indicated for disinfecting solutions.  Subjective:     Patient ID: Pamela Mathews , female    DOB: 03/08/1946 , 75 y.o.   MRN: [id number]   Chief Complaint  Patient presents with   Hypertension     HPI  The patient is here today for a blood pressure f/u.  Continues to be followed by Rheumatology  Hypertension This is a chronic problem. The current episode started more than 1 year ago. The problem is unchanged. The problem is controlled. Pertinent negatives include no anxiety, chest pain, headaches or palpitations. There are no associated agents to hypertension. Risk factors for coronary artery disease include sedentary lifestyle. Past treatments include diuretics and beta blockers. There are no compliance problems.  There is no history of angina. There is no history of chronic renal disease.    Past Medical History:  Diagnosis Date   Cellulitis    Hypertension    Peripheral arterial disease ([place])    Ulcer      Family History  Problem Relation Age of Onset   Hypertension Mother    Breast cancer Neg Hx      Current Outpatient Medications:    folic acid (FOLVITE) 1 MG tablet, Take 1 mg by mouth daily., Disp: , Rfl:    hydrochlorothiazide (HYDRODIURIL) 12.5 MG tablet, TAKE 1 TABLET(12.5 MG) BY MOUTH DAILY, Disp: 90 tablet, Rfl: 1   methotrexate (RHEUMATREX) 2.5 MG tablet, Take 2.5 mg by mouth once a week. Caution:Chemotherapy. Protect from light., Disp: , Rfl:    metoprolol succinate (TOPROL-XL) 25 MG 24  hr tablet, TAKE 1 TABLET(25 MG) BY MOUTH DAILY, Disp: 90 tablet, Rfl: 1   VITAMIN D PO, Take 1 tablet by mouth daily., Disp: , Rfl:    No Known Allergies   Review of Systems  Constitutional: Negative.   Respiratory: Negative.    Cardiovascular: Negative.  Negative for chest pain, palpitations and leg swelling.  Musculoskeletal:        Left knee is in a brace  Neurological:  Negative for headaches.  Psychiatric/Behavioral: Negative.      There were no vitals filed for this visit. There is no height or weight on file to calculate BMI.  Wt Readings from Last 3 Encounters:  07/23/21 103 lb 12.8 oz (47.1 kg)  03/30/21 101 lb 12.8 oz (46.2 kg)  11/17/20 99 lb 3.2 oz (45 kg)    BP Readings from Last 3 Encounters:  07/23/21 102/60  03/30/21 114/70  11/17/20 118/64    Objective:  Physical Exam Vitals reviewed.  Constitutional:      General: She is not in acute distress.    Appearance: Normal appearance. She is well-developed and normal weight.  HENT:     Head: Normocephalic and atraumatic.  Cardiovascular:     Rate and Rhythm: Normal rate and regular rhythm.     Pulses: Normal pulses.     Heart sounds: Normal heart sounds. No murmur heard. Pulmonary:     Effort: Pulmonary effort is normal. No respiratory distress.     Breath sounds: Normal breath  sounds. No wheezing.  Musculoskeletal:        General: Swelling: left knee.     Comments: Limited range of motion to left knee, brace intact  Skin:    General: Skin is warm and dry.     Capillary Refill: Capillary refill takes less than 2 seconds.  Neurological:     General: No focal deficit present.     Mental Status: She is alert and oriented to person, place, and time.     Cranial Nerves: No cranial nerve deficit.     Motor: No weakness.  Psychiatric:        Mood and Affect: Mood normal.        Behavior: Behavior normal.        Thought Content: Thought content normal.        Judgment: Judgment normal.        Assessment  And Plan:     1. Essential hypertension Comments: blood pressure is well controlled Continue current medications - BMP8+EGFR - metoprolol succinate (TOPROL-XL) 25 MG 24 hr tablet; TAKE 1 TABLET(25 MG) BY MOUTH DAILY  Dispense: 90 tablet; Refill: 1 - hydrochlorothiazide (HYDRODIURIL) 12.5 MG tablet; TAKE 1 TABLET(12.5 MG) BY MOUTH DAILY  Dispense: 90 tablet; Refill: 1  2. Prediabetes Comments: Stable, no current medications - Hemoglobin A1c  3. Mixed hyperlipidemia Comments: Stable, no current medications - Lipid panel  4. 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 Patient verbalized understanding of the plan and was able to repeat key elements of the plan. All questions were answered to their satisfaction.  Pamela Brine, FNP   I, Pamela Brine, FNP, have reviewed all documentation for this visit. The documentation on 07/24/21 for the exam, diagnosis, procedures, and orders are all accurate and complete.   IF YOU HAVE BEEN REFERRED TO A SPECIALIST, IT MAY TAKE 1-2 WEEKS TO SCHEDULE/PROCESS THE REFERRAL. IF YOU HAVE NOT HEARD FROM US/SPECIALIST IN TWO WEEKS, PLEASE GIVE [place] A CALL AT [phone].   THE PATIENT IS ENCOURAGED TO PRACTICE SOCIAL DISTANCING DUE TO THE COVID-19 PANDEMIC.

## 2021-07-23 NOTE — Progress Notes (Signed)
This visit occurred during the SARS-CoV-2 public health emergency.  Safety protocols were in place, including screening questions prior to the visit, additional usage of staff PPE, and extensive cleaning of exam room while observing appropriate contact time as indicated for disinfecting solutions.  Subjective:   Pamela Mathews is a 75 y.o. female who presents for Medicare Annual (Subsequent) preventive examination.  Review of Systems     Cardiac Risk Factors include: advanced age (>1men, >51 women);dyslipidemia;hypertension;sedentary lifestyle     Objective:    Today's Vitals   07/23/21 1506  BP: 102/60  Pulse: 87  Temp: 97.9 F (36.6 C)  TempSrc: Oral  SpO2: 97%  Weight: 103 lb 12.8 oz (47.1 kg)  Height: 5' (1.524 m)   Body mass index is 20.27 kg/m.  Advanced Directives 07/23/2021 07/09/2020 07/03/2019 09/08/2018  Does Patient Have a Medical Advance Directive? No No No No  Would patient like information on creating a medical advance directive? - No - Patient declined - Yes (MAU/Ambulatory/Procedural Areas - Information given)    Current Medications (verified) Outpatient Encounter Medications as of 07/23/2021  Medication Sig   folic acid (FOLVITE) 1 MG tablet Take 1 mg by mouth daily.   hydrochlorothiazide (HYDRODIURIL) 12.5 MG tablet TAKE 1 TABLET(12.5 MG) BY MOUTH DAILY   methotrexate (RHEUMATREX) 2.5 MG tablet Take 2.5 mg by mouth once a week. Caution:Chemotherapy. Protect from light.   metoprolol succinate (TOPROL-XL) 25 MG 24 hr tablet TAKE 1 TABLET(25 MG) BY MOUTH DAILY   VITAMIN D PO Take 1 tablet by mouth daily.   No facility-administered encounter medications on file as of 07/23/2021.    Allergies (verified) Patient has no known allergies.   History: Past Medical History:  Diagnosis Date   Cellulitis    Hypertension    Peripheral arterial disease (HCC)    Ulcer    History reviewed. No pertinent surgical history. Family History  Problem Relation Age  of Onset   Hypertension Mother    Breast cancer Neg Hx    Social History   Socioeconomic History   Marital status: Divorced    Spouse name: Not on file   Number of children: Not on file   Years of education: Not on file   Highest education level: Not on file  Occupational History   Occupation: retired  Tobacco Use   Smoking status: Former    Types: Cigarettes    Quit date: 08/2017    Years since quitting: 3.9   Smokeless tobacco: Never  Vaping Use   Vaping Use: Never used  Substance and Sexual Activity   Alcohol use: Not Currently   Drug use: Never   Sexual activity: Not Currently  Other Topics Concern   Not on file  Social History Narrative   Not on file   Social Determinants of Health   Financial Resource Strain: Low Risk    Difficulty of Paying Living Expenses: Not hard at all  Food Insecurity: No Food Insecurity   Worried About Programme researcher, broadcasting/film/video in the Last Year: Never true   Ran Out of Food in the Last Year: Never true  Transportation Needs: No Transportation Needs   Lack of Transportation (Medical): No   Lack of Transportation (Non-Medical): No  Physical Activity: Inactive   Days of Exercise per Week: 0 days   Minutes of Exercise per Session: 0 min  Stress: No Stress Concern Present   Feeling of Stress : Not at all  Social Connections: Not on file    Tobacco  Counseling Counseling given: Not Answered   Clinical Intake:  Pre-visit preparation completed: Yes  Pain : No/denies pain     Nutritional Status: BMI of 19-24  Normal Nutritional Risks: None Diabetes: No  How often do you need to have someone help you when you read instructions, pamphlets, or other written materials from your doctor or pharmacy?: 1 - Never What is the last grade level you completed in school?: 12th grade  Diabetic? no  Interpreter Needed?: No  Information entered by :: NAllen LPN   Activities of Daily Living In your present state of health, do you have any  difficulty performing the following activities: 07/23/2021  Hearing? N  Vision? N  Difficulty concentrating or making decisions? N  Walking or climbing stairs? N  Dressing or bathing? N  Doing errands, shopping? N  Preparing Food and eating ? N  Using the Toilet? N  In the past six months, have you accidently leaked urine? N  Do you have problems with loss of bowel control? N  Managing your Medications? N  Managing your Finances? N  Housekeeping or managing your Housekeeping? N  Some recent data might be hidden    Patient Care Team: Arnette Felts, FNP as PCP - General (General Practice)  Indicate any recent Medical Services you may have received from other than [facility] providers in the past year (date may be approximate).     Assessment:   This is a routine wellness examination for [place].  Hearing/Vision screen No results found.  Dietary issues and exercise activities discussed: Current Exercise Habits: The patient does not participate in regular exercise at present   Goals Addressed             This Visit's Progress    Patient Stated       07/23/2021, maintaining BP       Depression Screen PHQ 2/9 Scores 07/23/2021 07/09/2020 10/08/2019 07/03/2019 07/03/2019 09/08/2018  PHQ - 2 Score 0 0 0 0 0 0    Fall Risk Fall Risk  07/23/2021 07/09/2020 02/06/2020 10/08/2019 07/03/2019  Falls in the past year? 0 1 1 0 0  Comment - slipped on a toy - - -  Number falls in past yr: - 0 0 - -  Injury with Fall? - 1 1 - -  Comment - fractured knee Fractured Knee - -  Risk for fall due to : Impaired balance/gait;Impaired mobility;Medication side effect Impaired balance/gait;Impaired mobility;History of fall(s);Medication side effect - - Medication side effect  Follow up Falls evaluation completed;Education provided;Falls prevention discussed Falls evaluation completed;Education provided;Falls prevention discussed - - Falls evaluation completed;Education provided;Falls prevention discussed     FALL RISK PREVENTION PERTAINING TO THE HOME:  Any stairs in or around the home? Yes  If so, are there any without handrails? Yes  Home free of loose throw rugs in walkways, pet beds, electrical cords, etc? Yes  Adequate lighting in your home to reduce risk of falls? Yes   ASSISTIVE DEVICES UTILIZED TO PREVENT FALLS:  Life alert? No  Use of a cane, walker or w/c? Yes  Grab bars in the bathroom? No  Shower chair or bench in shower? No  Elevated toilet seat or a handicapped toilet? No   TIMED UP AND GO:  Was the test performed? No .     Gait slow and steady with assistive device  Cognitive Function:     6CIT Screen 07/23/2021 07/09/2020 07/03/2019  What Year? 0 points 0 points 0 points  What month? 0  points 0 points 0 points  What time? 0 points 0 points 0 points  Count back from 20 0 points 0 points 0 points  Months in reverse 2 points 0 points 2 points  Repeat phrase 0 points 2 points 0 points  Total Score 2 2 2     Immunizations  There is no immunization history on file for this patient.  TDAP status: Due, Education has been provided regarding the importance of this vaccine. Advised may receive this vaccine at local pharmacy or Health Dept. Aware to provide a copy of the vaccination record if obtained from local pharmacy or Health Dept. Verbalized acceptance and understanding.  Flu Vaccine status: Declined, Education has been provided regarding the importance of this vaccine but patient still declined. Advised may receive this vaccine at local pharmacy or Health Dept. Aware to provide a copy of the vaccination record if obtained from local pharmacy or Health Dept. Verbalized acceptance and understanding.  Pneumococcal vaccine status: Declined,  Education has been provided regarding the importance of this vaccine but patient still declined. Advised may receive this vaccine at local pharmacy or Health Dept. Aware to provide a copy of the vaccination record if obtained from  local pharmacy or Health Dept. Verbalized acceptance and understanding.   Covid-19 vaccine status: Declined, Education has been provided regarding the importance of this vaccine but patient still declined. Advised may receive this vaccine at local pharmacy or Health Dept.or vaccine clinic. Aware to provide a copy of the vaccination record if obtained from local pharmacy or Health Dept. Verbalized acceptance and understanding.  Qualifies for Shingles Vaccine? Yes   Zostavax completed No   Shingrix Completed?: No.    Education has been provided regarding the importance of this vaccine. Patient has been advised to call insurance company to determine out of pocket expense if they have not yet received this vaccine. Advised may also receive vaccine at local pharmacy or Health Dept. Verbalized acceptance and understanding.  Screening Tests Health Maintenance  Topic Date Due   COVID-19 Vaccine (1) Never done   Zoster Vaccines- Shingrix (1 of 2) Never done   COLONOSCOPY (Pts 45-39yrs Insurance coverage will need to be confirmed)  Never done   INFLUENZA VACCINE  Never done   TETANUS/TDAP  03/30/2022 (Originally 01/14/1965)   DEXA SCAN  Completed   Hepatitis C Screening  Completed   HPV VACCINES  Aged Out    Health Maintenance  Health Maintenance Due  Topic Date Due   COVID-19 Vaccine (1) Never done   Zoster Vaccines- Shingrix (1 of 2) Never done   COLONOSCOPY (Pts 45-78yrs Insurance coverage will need to be confirmed)  Never done   INFLUENZA VACCINE  Never done    Colorectal cancer screening: No longer required.   Mammogram status: patient to schedule  Bone Density status: Completed 01/10/2020.   Lung Cancer Screening: (Low Dose CT Chest recommended if Age 80-80 years, 30 pack-year currently smoking OR have quit w/in 15years.) does not qualify.   Lung Cancer Screening Referral: no  Additional Screening:  Hepatitis C Screening: does qualify; Completed 12/08/2018  Vision Screening:  Recommended annual ophthalmology exams for early detection of glaucoma and other disorders of the eye. Is the patient up to date with their annual eye exam?  No  Who is the provider or what is the name of the office in which the patient attends annual eye exams? [facility] If pt is not established with a provider, would they like to be referred to a provider to  establish care? No .   Dental Screening: Recommended annual dental exams for proper oral hygiene  Community Resource Referral / Chronic Care Management: CRR required this visit?  No   CCM required this visit?  No      Plan:     I have personally reviewed and noted the following in the patient's chart:   Medical and social history Use of alcohol, tobacco or illicit drugs  Current medications and supplements including opioid prescriptions.  Functional ability and status Nutritional status Physical activity Advanced directives List of other physicians Hospitalizations, surgeries, and ER visits in previous 12 months Vitals Screenings to include cognitive, depression, and falls Referrals and appointments  In addition, I have reviewed and discussed with patient certain preventive protocols, quality metrics, and best practice recommendations. A written personalized care plan for preventive services as well as general preventive health recommendations were provided to patient.     Barb Merino, LPN   [id number]   Nurse Notes:

## 2021-07-23 NOTE — Patient Instructions (Signed)

## 2021-07-23 NOTE — Patient Instructions (Signed)
Pamela Mathews , Thank you for taking time to come for your Medicare Wellness Visit. I appreciate your ongoing commitment to your health goals. Please review the following plan we discussed and let me know if I can assist you in the future.   Screening recommendations/referrals: Colonoscopy: not required Mammogram: patient to schedule Bone Density: completed 01/10/2020 Recommended yearly ophthalmology/optometry visit for glaucoma screening and checkup Recommended yearly dental visit for hygiene and checkup  Vaccinations: Influenza vaccine: decline Pneumococcal vaccine: decline Tdap vaccine: decline Shingles vaccine: decline   Covid-19: decline  Advanced directives: Advance directive discussed with you today. Even though you declined this today please call our office should you change your mind and we can give you the proper paperwork for you to fill out.  Conditions/risks identified: none  Next appointment: Follow up in one year for your annual wellness visit    Preventive Care 65 Years and Older, Female Preventive care refers to lifestyle choices and visits with your health care provider that can promote health and wellness. What does preventive care include? A yearly physical exam. This is also called an annual well check. Dental exams once or twice a year. Routine eye exams. Ask your health care provider how often you should have your eyes checked. Personal lifestyle choices, including: Daily care of your teeth and gums. Regular physical activity. Eating a healthy diet. Avoiding tobacco and drug use. Limiting alcohol use. Practicing safe sex. Taking low-dose aspirin every day. Taking vitamin and mineral supplements as recommended by your health care provider. What happens during an annual well check? The services and screenings done by your health care provider during your annual well check will depend on your age, overall health, lifestyle risk factors, and family history of  disease. Counseling  Your health care provider may ask you questions about your: Alcohol use. Tobacco use. Drug use. Emotional well-being. Home and relationship well-being. Sexual activity. Eating habits. History of falls. Memory and ability to understand (cognition). Work and work Astronomer. Reproductive health. Screening  You may have the following tests or measurements: Height, weight, and BMI. Blood pressure. Lipid and cholesterol levels. These may be checked every 5 years, or more frequently if you are over 38 years old. Skin check. Lung cancer screening. You may have this screening every year starting at age 42 if you have a 30-pack-year history of smoking and currently smoke or have quit within the past 15 years. Fecal occult blood test (FOBT) of the stool. You may have this test every year starting at age 73. Flexible sigmoidoscopy or colonoscopy. You may have a sigmoidoscopy every 5 years or a colonoscopy every 10 years starting at age 79. Hepatitis C blood test. Hepatitis B blood test. Sexually transmitted disease (STD) testing. Diabetes screening. This is done by checking your blood sugar (glucose) after you have not eaten for a while (fasting). You may have this done every 1-3 years. Bone density scan. This is done to screen for osteoporosis. You may have this done starting at age 93. Mammogram. This may be done every 1-2 years. Talk to your health care provider about how often you should have regular mammograms. Talk with your health care provider about your test results, treatment options, and if necessary, the need for more tests. Vaccines  Your health care provider may recommend certain vaccines, such as: Influenza vaccine. This is recommended every year. Tetanus, diphtheria, and acellular pertussis (Tdap, Td) vaccine. You may need a Td booster every 10 years. Zoster vaccine. You may need this  after age 69. Pneumococcal 13-valent conjugate (PCV13) vaccine. One  dose is recommended after age 55. Pneumococcal polysaccharide (PPSV23) vaccine. One dose is recommended after age 54. Talk to your health care provider about which screenings and vaccines you need and how often you need them. This information is not intended to replace advice given to you by your health care provider. Make sure you discuss any questions you have with your health care provider. Document Released: 10/31/2015 Document Revised: 06/23/2016 Document Reviewed: 08/05/2015 Elsevier Interactive Patient Education  2017 [place] Prevention in the Home Falls can cause injuries. They can happen to people of all ages. There are many things you can do to make your home safe and to help prevent falls. What can I do on the outside of my home? Regularly fix the edges of walkways and driveways and fix any cracks. Remove anything that might make you trip as you walk through a door, such as a raised step or threshold. Trim any bushes or trees on the path to your home. Use bright outdoor lighting. Clear any walking paths of anything that might make someone trip, such as rocks or tools. Regularly check to see if handrails are loose or broken. Make sure that both sides of any steps have handrails. Any raised decks and porches should have guardrails on the edges. Have any leaves, snow, or ice cleared regularly. Use sand or salt on walking paths during winter. Clean up any spills in your garage right away. This includes oil or grease spills. What can I do in the bathroom? Use night lights. Install grab bars by the toilet and in the tub and shower. Do not use towel bars as grab bars. Use non-skid mats or decals in the tub or shower. If you need to sit down in the shower, use a plastic, non-slip stool. Keep the floor dry. Clean up any water that spills on the floor as soon as it happens. Remove soap buildup in the tub or shower regularly. Attach bath mats securely with double-sided  non-slip rug tape. Do not have throw rugs and other things on the floor that can make you trip. What can I do in the bedroom? Use night lights. Make sure that you have a light by your bed that is easy to reach. Do not use any sheets or blankets that are too big for your bed. They should not hang down onto the floor. Have a firm chair that has side arms. You can use this for support while you get dressed. Do not have throw rugs and other things on the floor that can make you trip. What can I do in the kitchen? Clean up any spills right away. Avoid walking on wet floors. Keep items that you use a lot in easy-to-reach places. If you need to reach something above you, use a strong step stool that has a grab bar. Keep electrical cords out of the way. Do not use floor polish or wax that makes floors slippery. If you must use wax, use non-skid floor wax. Do not have throw rugs and other things on the floor that can make you trip. What can I do with my stairs? Do not leave any items on the stairs. Make sure that there are handrails on both sides of the stairs and use them. Fix handrails that are broken or loose. Make sure that handrails are as long as the stairways. Check any carpeting to make sure that it is firmly attached to the  stairs. Fix any carpet that is loose or worn. Avoid having throw rugs at the top or bottom of the stairs. If you do have throw rugs, attach them to the floor with carpet tape. Make sure that you have a light switch at the top of the stairs and the bottom of the stairs. If you do not have them, ask someone to add them for you. What else can I do to help prevent falls? Wear shoes that: Do not have high heels. Have rubber bottoms. Are comfortable and fit you well. Are closed at the toe. Do not wear sandals. If you use a stepladder: Make sure that it is fully opened. Do not climb a closed stepladder. Make sure that both sides of the stepladder are locked into place. Ask  someone to hold it for you, if possible. Clearly mark and make sure that you can see: Any grab bars or handrails. First and last steps. Where the edge of each step is. Use tools that help you move around (mobility aids) if they are needed. These include: Canes. Walkers. Scooters. Crutches. Turn on the lights when you go into a dark area. Replace any light bulbs as soon as they burn out. Set up your furniture so you have a clear path. Avoid moving your furniture around. If any of your floors are uneven, fix them. If there are any pets around you, be aware of where they are. Review your medicines with your doctor. Some medicines can make you feel dizzy. This can increase your chance of falling. Ask your doctor what other things that you can do to help prevent falls. This information is not intended to replace advice given to you by your health care provider. Make sure you discuss any questions you have with your health care provider. Document Released: 07/31/2009 Document Revised: 03/11/2016 Document Reviewed: 11/08/2014 Elsevier Interactive Patient Education  2017 [employer].

## 2021-07-24 ENCOUNTER — Encounter: Payer: Self-pay | Admitting: Nurse Practitioner

## 2021-07-24 LAB — LIPID PANEL
Chol/HDL Ratio: 3.8 ratio (ref 0.0–4.4)
Cholesterol, Total: 224 mg/dL — ABNORMAL HIGH (ref 100–199)
HDL: 59 mg/dL (ref >39)
LDL Chol Calc (NIH): 146 mg/dL — ABNORMAL HIGH (ref 0–99)
Triglycerides: 109 mg/dL (ref 0–149)
VLDL Cholesterol Cal: 19 mg/dL (ref 5–40)

## 2021-07-24 LAB — HEMOGLOBIN A1C
Est. average glucose Bld gHb Est-mCnc: 120 mg/dL
Hgb A1c MFr Bld: 5.8 % — ABNORMAL HIGH (ref 4.8–5.6)

## 2021-07-24 LAB — BMP8+EGFR
BUN/Creatinine Ratio: 24 (ref 12–28)
BUN: 15 mg/dL (ref 8–27)
CO2: 22 mmol/L (ref 20–29)
Calcium: 9.3 mg/dL (ref 8.7–10.3)
Chloride: 100 mmol/L (ref 96–106)
Creatinine, Ser: 0.63 mg/dL (ref 0.57–1.00)
Glucose: 81 mg/dL (ref 70–99)
Potassium: 4.1 mmol/L (ref 3.5–5.2)
Sodium: 139 mmol/L (ref 134–144)
eGFR: 92 mL/min/{1.73_m2} (ref >59)

## 2021-08-03 DIAGNOSIS — M1990 Unspecified osteoarthritis, unspecified site: Secondary | ICD-10-CM | POA: Diagnosis not present

## 2021-08-03 DIAGNOSIS — Z79899 Other long term (current) drug therapy: Secondary | ICD-10-CM | POA: Diagnosis not present

## 2021-08-03 DIAGNOSIS — M0589 Other rheumatoid arthritis with rheumatoid factor of multiple sites: Secondary | ICD-10-CM | POA: Diagnosis not present

## 2021-08-03 DIAGNOSIS — M79644 Pain in right finger(s): Secondary | ICD-10-CM | POA: Diagnosis not present

## 2021-08-03 DIAGNOSIS — M810 Age-related osteoporosis without current pathological fracture: Secondary | ICD-10-CM | POA: Diagnosis not present

## 2021-11-17 ENCOUNTER — Telehealth: Payer: Self-pay

## 2021-11-17 NOTE — Telephone Encounter (Signed)
Pt called wanting to resch physical appointment, unable to reach pt. Vm not set up.

## 2021-11-18 ENCOUNTER — Encounter: Payer: Medicare Other | Admitting: Nurse Practitioner

## 2021-11-30 DIAGNOSIS — M1990 Unspecified osteoarthritis, unspecified site: Secondary | ICD-10-CM | POA: Diagnosis not present

## 2021-11-30 DIAGNOSIS — M810 Age-related osteoporosis without current pathological fracture: Secondary | ICD-10-CM | POA: Diagnosis not present

## 2021-11-30 DIAGNOSIS — Z79899 Other long term (current) drug therapy: Secondary | ICD-10-CM | POA: Diagnosis not present

## 2021-11-30 DIAGNOSIS — M79644 Pain in right finger(s): Secondary | ICD-10-CM | POA: Diagnosis not present

## 2021-11-30 DIAGNOSIS — M0589 Other rheumatoid arthritis with rheumatoid factor of multiple sites: Secondary | ICD-10-CM | POA: Diagnosis not present

## 2021-12-04 ENCOUNTER — Other Ambulatory Visit: Payer: Self-pay

## 2021-12-04 DIAGNOSIS — I10 Essential (primary) hypertension: Secondary | ICD-10-CM

## 2021-12-04 MED ORDER — HYDROCHLOROTHIAZIDE 12.5 MG PO TABS: ORAL_TABLET | ORAL | 0 refills | Status: DC

## 2021-12-07 ENCOUNTER — Other Ambulatory Visit: Payer: Self-pay

## 2021-12-07 DIAGNOSIS — I10 Essential (primary) hypertension: Secondary | ICD-10-CM

## 2021-12-07 MED ORDER — HYDROCHLOROTHIAZIDE 12.5 MG PO TABS: ORAL_TABLET | ORAL | 0 refills | Status: DC

## 2021-12-16 ENCOUNTER — Ambulatory Visit: Payer: Medicare Other | Admitting: Nurse Practitioner

## 2022-01-13 ENCOUNTER — Other Ambulatory Visit: Payer: Self-pay | Admitting: Nurse Practitioner

## 2022-01-13 DIAGNOSIS — I10 Essential (primary) hypertension: Secondary | ICD-10-CM

## 2022-01-18 ENCOUNTER — Encounter: Payer: Self-pay | Admitting: Nurse Practitioner

## 2022-01-18 ENCOUNTER — Ambulatory Visit (INDEPENDENT_AMBULATORY_CARE_PROVIDER_SITE_OTHER): Payer: Medicare Other | Admitting: Nurse Practitioner

## 2022-01-18 VITALS — BP 130/70 | HR 59 | Temp 97.9°F | Ht 60.0 in | Wt 108.0 lb

## 2022-01-18 DIAGNOSIS — E782 Mixed hyperlipidemia: Secondary | ICD-10-CM

## 2022-01-18 DIAGNOSIS — R7303 Prediabetes: Secondary | ICD-10-CM

## 2022-01-18 DIAGNOSIS — M06042 Rheumatoid arthritis without rheumatoid factor, left hand: Secondary | ICD-10-CM | POA: Diagnosis not present

## 2022-01-18 DIAGNOSIS — I10 Essential (primary) hypertension: Secondary | ICD-10-CM

## 2022-01-18 DIAGNOSIS — R051 Acute cough: Secondary | ICD-10-CM

## 2022-01-18 DIAGNOSIS — Z2821 Immunization not carried out because of patient refusal: Secondary | ICD-10-CM | POA: Diagnosis not present

## 2022-01-18 MED ORDER — HYDROCHLOROTHIAZIDE 12.5 MG PO TABS: ORAL_TABLET | ORAL | 1 refills | Status: AC

## 2022-01-18 NOTE — Progress Notes (Signed)
?Industrial/product designer as a Education administrator for [employer], FNP.,have documented all relevant documentation on the behalf of [name], FNP,as directed by  [name], FNP while in the presence of [name], [place]. ? ?This visit occurred during the SARS-CoV-2 public health emergency.  Safety protocols were in place, including screening questions prior to the visit, additional usage of staff PPE, and extensive cleaning of exam room while observing appropriate contact time as indicated for disinfecting solutions. ? ?Subjective:  ?  ? Patient ID: Pamela Mathews , female    DOB: 30-Jul-1946 , 76 y.o.   MRN: [id number] ? ? ?Chief Complaint  ?Patient presents with  ? Hypertension  ? ? ?HPI ? ?The patient is here today for a blood pressure f/u.  Continues to be followed by Rheumatology. ? ?BP Readings from Last 3 Encounters: ?01/18/22 : 130/70 ?07/23/21 : 102/60 ?03/30/21 : 114/70 ? ? ? ?Hypertension ?This is a chronic problem. The current episode started more than 1 year ago. The problem is unchanged. The problem is controlled. Pertinent negatives include no anxiety, chest pain, headaches or palpitations. There are no associated agents to hypertension. Risk factors for coronary artery disease include sedentary lifestyle. Past treatments include diuretics and beta blockers. There are no compliance problems.  There is no history of angina. There is no history of chronic renal disease.   ? ?Past Medical History:  ?Diagnosis Date  ? Cellulitis   ? Hypertension   ? Peripheral arterial disease ([place])   ? Ulcer   ?  ? ?Family History  ?Problem Relation Age of Onset  ? Hypertension Mother   ? Breast cancer Neg Hx   ? ? ? ?Current Outpatient Medications:  ?  folic acid (FOLVITE) 1 MG tablet, Take 1 mg by mouth daily., Disp: , Rfl:  ?  methotrexate (RHEUMATREX) 2.5 MG tablet, Take 2.5 mg by mouth once a week. Caution:Chemotherapy. Protect from light., Disp: , Rfl:  ?  metoprolol succinate (TOPROL-XL) 25 MG 24 hr tablet, TAKE 1  TABLET(25 MG) BY MOUTH DAILY, Disp: 90 tablet, Rfl: 1 ?  VITAMIN D PO, Take 1 tablet by mouth daily., Disp: , Rfl:  ?  hydrochlorothiazide (HYDRODIURIL) 12.5 MG tablet, Take 1 tablet by mouth daily, Disp: 90 tablet, Rfl: 1  ? ?No Known Allergies  ? ?Review of Systems  ?Constitutional: Negative.   ?Respiratory: Negative.    ?Cardiovascular: Negative.  Negative for chest pain, palpitations and leg swelling.  ?Gastrointestinal: Negative.   ?Neurological: Negative.  Negative for headaches.   ? ?Today's Vitals  ? 01/18/22 1630  ?BP: 130/70  ?Pulse: (!) 59  ?Temp: 97.9 ?F (36.6 ?C)  ?TempSrc: Oral  ?Weight: 108 lb (49 kg)  ?Height: 5' (1.524 m)  ? ?Body mass index is 21.09 kg/m?.  ? ?Objective:  ?Physical Exam ?Vitals reviewed.  ?Constitutional:   ?   General: She is not in acute distress. ?   Appearance: Normal appearance. She is well-developed and normal weight.  ?HENT:  ?   Head: Normocephalic and atraumatic.  ?Cardiovascular:  ?   Rate and Rhythm: Normal rate and regular rhythm.  ?   Pulses: Normal pulses.  ?   Heart sounds: Normal heart sounds. No murmur heard. ?Pulmonary:  ?   Effort: Pulmonary effort is normal. No respiratory distress.  ?   Breath sounds: Normal air entry. Examination of the right-lower field reveals decreased breath sounds. Examination of the left-lower field reveals decreased breath sounds. Decreased breath sounds present. No wheezing.  ?Musculoskeletal:     ?  General: No swelling or tenderness.  ?   Comments: Limited range of motion to left knee, brace intact  ?Skin: ?   General: Skin is warm and dry.  ?   Capillary Refill: Capillary refill takes less than 2 seconds.  ?Neurological:  ?   General: No focal deficit present.  ?   Mental Status: She is alert and oriented to person, place, and time.  ?   Cranial Nerves: No cranial nerve deficit.  ?   Motor: No weakness.  ?Psychiatric:     ?   Mood and Affect: Mood normal.     ?   Behavior: Behavior normal.     ?   Thought Content: Thought content  normal.     ?   Judgment: Judgment normal.  ?  ? ?   ?Assessment And Plan:  ?   ?1. Benign hypertension without congestive heart failure ?Comments: Blood pressure controlled, continue current medications.  ?- BMP8+EGFR ?- hydrochlorothiazide (HYDRODIURIL) 12.5 MG tablet; Take 1 tablet by mouth daily  Dispense: 90 tablet; Refill: 1 ?- Hemoglobin A1c ?- BMP8+eGFR ? ?2. Prediabetes ?Comments: Stable, diet controlled. Continue eating a healthy diet.  ?- Hemoglobin A1c ? ?3. Mixed hyperlipidemia ?Comments: Good control, no current medications diet controlled, low fat ?- Lipid panel ? ?4. 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 ?5. Acute cough ?Comments: Decreased breath sounds bilateral lower lobes. I have advised her to go for a chest xray. Denies shortness of breath.  ?- DG Chest 2 View; Future ? ?6. COVID-19 vaccination declined ?Declines covid 19 vaccine. Discussed risk of covid 44 and if she changes her mind about the vaccine to call the office.  Encouraged to take multivitamin, vitamin d, vitamin c and zinc to increase immune system. Aware can call office if would like to have vaccine here at office.  ? ? ? ?Patient was given opportunity to ask questions. Patient verbalized understanding of the plan and was able to repeat key elements of the plan. All questions were answered to their satisfaction.  ?[name], FNP  ? ?I, [name], FNP, have reviewed all documentation for this visit. The documentation on 01/18/22 for the exam, diagnosis, procedures, and orders are all accurate and complete.  ? ?IF YOU HAVE BEEN REFERRED TO A SPECIALIST, IT MAY TAKE 1-2 WEEKS TO SCHEDULE/PROCESS THE REFERRAL. IF YOU HAVE NOT HEARD FROM US/SPECIALIST IN TWO WEEKS, PLEASE GIVE [place] A CALL AT [phone] X [phone].  ? ?THE PATIENT IS ENCOURAGED TO PRACTICE SOCIAL DISTANCING DUE TO THE COVID-19 PANDEMIC.   ?

## 2022-01-18 NOTE — Patient Instructions (Addendum)
Hypertension, Adult ?High blood pressure (hypertension) is when the force of blood pumping through the arteries is too strong. The arteries are the blood vessels that carry blood from the heart throughout the body. Hypertension forces the heart to work harder to pump blood and may cause arteries to become narrow or stiff. Untreated or uncontrolled hypertension can cause a heart attack, heart failure, a stroke, kidney disease, and other problems. ?A blood pressure reading consists of a higher number over a lower number. Ideally, your blood pressure should be below 120/80. The first ("top") number is called the systolic pressure. It is a measure of the pressure in your arteries as your heart beats. The second ("bottom") number is called the diastolic pressure. It is a measure of the pressure in your arteries as the heart relaxes. ?What are the causes? ?The exact cause of this condition is not known. There are some conditions that result in or are related to high blood pressure. ?What increases the risk? ?Some risk factors for high blood pressure are under your control. The following factors may make you more likely to develop this condition: ?Smoking. ?Having type 2 diabetes mellitus, high cholesterol, or both. ?Not getting enough exercise or physical activity. ?Being overweight. ?Having too much fat, sugar, calories, or salt (sodium) in your diet. ?Drinking too much alcohol. ?Some risk factors for high blood pressure may be difficult or impossible to change. Some of these factors include: ?Having chronic kidney disease. ?Having a family history of high blood pressure. ?Age. Risk increases with age. ?Race. You may be at higher risk if you are African American. ?Gender. Men are at higher risk than women before age 57. After age 32, women are at higher risk than men. ?Having obstructive sleep apnea. ?Stress. ?What are the signs or symptoms? ?High blood pressure may not cause symptoms. Very high blood pressure  (hypertensive crisis) may cause: ?Headache. ?Anxiety. ?Shortness of breath. ?Nosebleed. ?Nausea and vomiting. ?Vision changes. ?Severe chest pain. ?Seizures. ?How is this diagnosed? ?This condition is diagnosed by measuring your blood pressure while you are seated, with your arm resting on a flat surface, your legs uncrossed, and your feet flat on the floor. The cuff of the blood pressure monitor will be placed directly against the skin of your upper arm at the level of your heart. It should be measured at least twice using the same arm. Certain conditions can cause a difference in blood pressure between your right and left arms. ?Certain factors can cause blood pressure readings to be lower or higher than normal for a short period of time: ?When your blood pressure is higher when you are in a health care provider's office than when you are at home, this is called white coat hypertension. Most people with this condition do not need medicines. ?When your blood pressure is higher at home than when you are in a health care provider's office, this is called masked hypertension. Most people with this condition may need medicines to control blood pressure. ?If you have a high blood pressure reading during one visit or you have normal blood pressure with other risk factors, you may be asked to: ?Return on a different day to have your blood pressure checked again. ?Monitor your blood pressure at home for 1 week or longer. ?If you are diagnosed with hypertension, you may have other blood or imaging tests to help your health care provider understand your overall risk for other conditions. ?How is this treated? ?This condition is treated by making  healthy lifestyle changes, such as eating healthy foods, exercising more, and reducing your alcohol intake. Your health care provider may prescribe medicine if lifestyle changes are not enough to get your blood pressure under control, and if: ?Your systolic blood pressure is above  130. ?Your diastolic blood pressure is above 80. ?Your personal target blood pressure may vary depending on your medical conditions, your age, and other factors. ?Follow these instructions at home: ?Eating and drinking ? ?Eat a diet that is high in fiber and potassium, and low in sodium, added sugar, and fat. An example eating plan is called the DASH (Dietary Approaches to Stop Hypertension) diet. To eat this way: ?Eat plenty of fresh fruits and vegetables. Try to fill one half of your plate at each meal with fruits and vegetables. ?Eat whole grains, such as whole-wheat pasta, brown rice, or whole-grain bread. Fill about one fourth of your plate with whole grains. ?Eat or drink low-fat dairy products, such as skim milk or low-fat yogurt. ?Avoid fatty cuts of meat, processed or cured meats, and poultry with skin. Fill about one fourth of your plate with lean proteins, such as fish, chicken without skin, beans, eggs, or tofu. ?Avoid pre-made and processed foods. These tend to be higher in sodium, added sugar, and fat. ?Reduce your daily sodium intake. Most people with hypertension should eat less than 1,500 mg of sodium a day. ?Do not drink alcohol if: ?Your health care provider tells you not to drink. ?You are pregnant, may be pregnant, or are planning to become pregnant. ?If you drink alcohol: ?Limit how much you use to: ?0-1 drink a day for women. ?0-2 drinks a day for men. ?Be aware of how much alcohol is in your drink. In the [place] one drink equals one 12 oz bottle of beer (355 mL), one 5 oz glass of wine (148 mL), or one 1? oz glass of hard liquor (44 mL). ?Lifestyle ? ?Work with your health care provider to maintain a healthy body weight or to lose weight. Ask what an ideal weight is for you. ?Get at least 30 minutes of exercise most days of the week. Activities may include walking, swimming, or biking. ?Include exercise to strengthen your muscles (resistance exercise), such as Pilates or lifting weights, as  part of your weekly exercise routine. Try to do these types of exercises for 30 minutes at least 3 days a week. ?Do not use any products that contain nicotine or tobacco, such as cigarettes, e-cigarettes, and chewing tobacco. If you need help quitting, ask your health care provider. ?Monitor your blood pressure at home as told by your health care provider. ?Keep all follow-up visits as told by your health care provider. This is important. ?Medicines ?Take over-the-counter and prescription medicines only as told by your health care provider. Follow directions carefully. Blood pressure medicines must be taken as prescribed. ?Do not skip doses of blood pressure medicine. Doing this puts you at risk for problems and can make the medicine less effective. ?Ask your health care provider about side effects or reactions to medicines that you should watch for. ?Contact a health care provider if you: ?Think you are having a reaction to a medicine you are taking. ?Have headaches that keep coming back (recurring). ?Feel dizzy. ?Have swelling in your ankles. ?Have trouble with your vision. ?Get help right away if you: ?Develop a severe headache or confusion. ?Have unusual weakness or numbness. ?Feel faint. ?Have severe pain in your chest or abdomen. ?Vomit repeatedly. ?Have trouble  breathing. ?Summary ?Hypertension is when the force of blood pumping through your arteries is too strong. If this condition is not controlled, it may put you at risk for serious complications. ?Your personal target blood pressure may vary depending on your medical conditions, your age, and other factors. For most people, a normal blood pressure is less than 120/80. ?Hypertension is treated with lifestyle changes, medicines, or a combination of both. Lifestyle changes include losing weight, eating a healthy, low-sodium diet, exercising more, and limiting alcohol. ?This information is not intended to replace advice given to you by your health care  provider. Make sure you discuss any questions you have with your health care provider. ?Document Revised: 06/14/2018 Document Reviewed: 06/14/2018 ?Elsevier Patient Education ? 2022 [employer] ? ? ?Go to [employer]

## 2022-01-19 LAB — LIPID PANEL
Chol/HDL Ratio: 3.4 ratio (ref 0.0–4.4)
Cholesterol, Total: 225 mg/dL — ABNORMAL HIGH (ref 100–199)
HDL: 67 mg/dL (ref >39)
LDL Chol Calc (NIH): 140 mg/dL — ABNORMAL HIGH (ref 0–99)
Triglycerides: 105 mg/dL (ref 0–149)
VLDL Cholesterol Cal: 18 mg/dL (ref 5–40)

## 2022-01-19 LAB — BMP8+EGFR
BUN/Creatinine Ratio: 22 (ref 12–28)
BUN: 16 mg/dL (ref 8–27)
CO2: 26 mmol/L (ref 20–29)
Calcium: 9.4 mg/dL (ref 8.7–10.3)
Chloride: 101 mmol/L (ref 96–106)
Creatinine, Ser: 0.74 mg/dL (ref 0.57–1.00)
Glucose: 86 mg/dL (ref 70–99)
Potassium: 4.6 mmol/L (ref 3.5–5.2)
Sodium: 142 mmol/L (ref 134–144)
eGFR: 84 mL/min/{1.73_m2} (ref >59)

## 2022-01-19 LAB — HEMOGLOBIN A1C
Est. average glucose Bld gHb Est-mCnc: 123 mg/dL
Hgb A1c MFr Bld: 5.9 % — ABNORMAL HIGH (ref 4.8–5.6)

## 2022-03-01 DIAGNOSIS — M1990 Unspecified osteoarthritis, unspecified site: Secondary | ICD-10-CM | POA: Diagnosis not present

## 2022-03-01 DIAGNOSIS — R700 Elevated erythrocyte sedimentation rate: Secondary | ICD-10-CM | POA: Diagnosis not present

## 2022-03-01 DIAGNOSIS — M810 Age-related osteoporosis without current pathological fracture: Secondary | ICD-10-CM | POA: Diagnosis not present

## 2022-03-01 DIAGNOSIS — M0589 Other rheumatoid arthritis with rheumatoid factor of multiple sites: Secondary | ICD-10-CM | POA: Diagnosis not present

## 2022-03-01 DIAGNOSIS — Z79899 Other long term (current) drug therapy: Secondary | ICD-10-CM | POA: Diagnosis not present

## 2022-03-01 DIAGNOSIS — M79644 Pain in right finger(s): Secondary | ICD-10-CM | POA: Diagnosis not present

## 2022-04-09 ENCOUNTER — Other Ambulatory Visit: Payer: Self-pay

## 2022-04-09 DIAGNOSIS — I10 Essential (primary) hypertension: Secondary | ICD-10-CM

## 2022-04-09 MED ORDER — METOPROLOL SUCCINATE ER 25 MG PO TB24: ORAL_TABLET | ORAL | 1 refills | Status: DC

## 2022-07-13 DIAGNOSIS — M0589 Other rheumatoid arthritis with rheumatoid factor of multiple sites: Secondary | ICD-10-CM | POA: Diagnosis not present

## 2022-07-13 DIAGNOSIS — Z79899 Other long term (current) drug therapy: Secondary | ICD-10-CM | POA: Diagnosis not present

## 2022-07-13 DIAGNOSIS — R700 Elevated erythrocyte sedimentation rate: Secondary | ICD-10-CM | POA: Diagnosis not present

## 2022-07-13 DIAGNOSIS — M1990 Unspecified osteoarthritis, unspecified site: Secondary | ICD-10-CM | POA: Diagnosis not present

## 2022-07-13 DIAGNOSIS — M79644 Pain in right finger(s): Secondary | ICD-10-CM | POA: Diagnosis not present

## 2022-07-13 DIAGNOSIS — M810 Age-related osteoporosis without current pathological fracture: Secondary | ICD-10-CM | POA: Diagnosis not present

## 2022-07-20 ENCOUNTER — Encounter: Payer: Self-pay | Admitting: Nurse Practitioner

## 2022-07-20 ENCOUNTER — Ambulatory Visit (INDEPENDENT_AMBULATORY_CARE_PROVIDER_SITE_OTHER): Payer: Medicare Other | Admitting: Nurse Practitioner

## 2022-07-20 VITALS — BP 150/90 | HR 76 | Temp 98.1°F | Ht 60.0 in

## 2022-07-20 DIAGNOSIS — R062 Wheezing: Secondary | ICD-10-CM | POA: Diagnosis not present

## 2022-07-20 DIAGNOSIS — R0989 Other specified symptoms and signs involving the circulatory and respiratory systems: Secondary | ICD-10-CM | POA: Diagnosis not present

## 2022-07-20 DIAGNOSIS — Z5320 Procedure and treatment not carried out because of patient's decision for unspecified reasons: Secondary | ICD-10-CM | POA: Diagnosis not present

## 2022-07-20 DIAGNOSIS — I10 Essential (primary) hypertension: Secondary | ICD-10-CM

## 2022-07-20 MED ORDER — ALBUTEROL SULFATE HFA 108 (90 BASE) MCG/ACT IN AERS: 2.0000 | INHALATION_SPRAY | Freq: Four times a day (QID) | RESPIRATORY_TRACT | 2 refills | Status: AC | PRN

## 2022-07-20 NOTE — Progress Notes (Signed)
I,Pamela Mathews,acting as a Education administrator for [name], FNP.,have documented all relevant documentation on the behalf of [name], FNP,as directed by  [name], FNP while in the presence of [name], [place].    Subjective:     Patient ID: Pamela Mathews , female    DOB: 1945-12-22 , 76 y.o.   MRN: [id number]   Chief Complaint  Patient presents with   Annual Exam    HPI  Pt presents today with cold symptoms. She has a runny nose & congestion. She has been around other family members who are sick, (her two great grandchildren and grandchild are sick). No medications.   She refuses covid test & flu test.      Past Medical History:  Diagnosis Date   Cellulitis    Hypertension    Peripheral arterial disease ([place])    Ulcer      Family History  Problem Relation Age of Onset   Hypertension Mother    Breast cancer Neg Hx      Current Outpatient Medications:    albuterol (VENTOLIN HFA) 108 (90 Base) MCG/ACT inhaler, Inhale 2 puffs into the lungs every 6 (six) hours as needed for wheezing or shortness of breath., Disp: 8 g, Rfl: 2   folic acid (FOLVITE) 1 MG tablet, Take 1 mg by mouth daily., Disp: , Rfl:    hydrochlorothiazide (HYDRODIURIL) 12.5 MG tablet, Take 1 tablet by mouth daily, Disp: 90 tablet, Rfl: 1   methotrexate (RHEUMATREX) 2.5 MG tablet, Take 2.5 mg by mouth once a week. Caution:Chemotherapy. Protect from light., Disp: , Rfl:    metoprolol succinate (TOPROL-XL) 25 MG 24 hr tablet, TAKE 1 TABLET(25 MG) BY MOUTH DAILY, Disp: 90 tablet, Rfl: 1   VITAMIN D PO, Take 1 tablet by mouth daily., Disp: , Rfl:    No Known Allergies   Review of Systems  Constitutional: Negative.   Respiratory:  Positive for wheezing. Negative for chest tightness.   Cardiovascular: Negative.  Negative for chest pain, palpitations and leg swelling.  Neurological: Negative.   Psychiatric/Behavioral: Negative.       Today's Vitals   07/20/22 1417  BP: (!) 150/90  Pulse: 76   Temp: 98.1 F (36.7 C)  SpO2: 98%  Height: 5' (1.524 m)  PainSc: 0-No pain   Body mass index is 21.09 kg/m.   Objective:  Physical Exam Vitals reviewed.  Constitutional:      Appearance: Normal appearance.  Cardiovascular:     Rate and Rhythm: Normal rate.  Pulmonary:     Effort: Pulmonary effort is normal. No respiratory distress.     Breath sounds: Wheezing (audible wheezes) present.  Skin:    General: Skin is warm and dry.     Capillary Refill: Capillary refill takes less than 2 seconds.  Neurological:     General: No focal deficit present.     Mental Status: She is alert and oriented to person, place, and time.     Cranial Nerves: No cranial nerve deficit.     Motor: No weakness.  Psychiatric:     Comments: She is eager to leave repeatedly stating "she just has a cold" and does not want to stay.          Assessment And Plan:     1. Chest congestion Comments: She has had for about 2-3 days, refuses to have a covid, flu swab.  She also refuses to allow me to listen to her lungs. Advised she is at risk for  pneumonia,   2. Wheezes Comments: Unable to auscultate lungs due to patient refusal. Made patient aware if she does not f/u or follow through I would have to discharge due to nonadherence. I am willing to send a Rx for albuterol inhaler and she is to get a CXR to ensure she does not have pneumonia. Explained to daughter and patient without a full assessment I can not determine if this is pneumonia.  - albuterol (VENTOLIN HFA) 108 (90 Base) MCG/ACT inhaler; Inhale 2 puffs into the lungs every 6 (six) hours as needed for wheezing or shortness of breath.  Dispense: 8 g; Refill: 2 - DG Chest 2 View; Future  3. Refuses treatment Refuses covid and flu swab. She also refuses to let me listen to her lungs. She would like to reschedule her physical however I explained to her if she is not going to adhere to plan of care I would have to discharge from practice. I called  daughter Ms. Silver to make her aware as well. She will try to get her to go for the CXR and get the inhaler. Explained the patients risk due to age.   4. Elevated blood pressure reading with diagnosis of hypertension Blood pressure is elevated but may be related to her being upset about getting a covid test. Unable to recheck due to patient left before having a complete assessment   Patient was given opportunity to ask questions. Patient verbalized understanding of the plan and was able to repeat key elements of the plan. All questions were answered to their satisfaction.  [name], FNP   I, [name], FNP, have reviewed all documentation for this visit. The documentation on 07/20/22 for the exam, diagnosis, procedures, and orders are all accurate and complete.   IF YOU HAVE BEEN REFERRED TO A SPECIALIST, IT MAY TAKE 1-2 WEEKS TO SCHEDULE/PROCESS THE REFERRAL. IF YOU HAVE NOT HEARD FROM US/SPECIALIST IN TWO WEEKS, PLEASE GIVE [place] A CALL AT [phone] X [phone].   THE PATIENT IS ENCOURAGED TO PRACTICE SOCIAL DISTANCING DUE TO THE COVID-19 PANDEMIC.

## 2022-07-29 ENCOUNTER — Telehealth: Payer: Self-pay

## 2022-07-29 ENCOUNTER — Ambulatory Visit: Payer: Medicare Other

## 2022-07-29 NOTE — Telephone Encounter (Signed)
This nurse attempted to call patient in regards to missing AWV appointment. Unable to leave a message.

## 2022-08-05 ENCOUNTER — Ambulatory Visit (INDEPENDENT_AMBULATORY_CARE_PROVIDER_SITE_OTHER): Payer: Medicare Other

## 2022-08-05 VITALS — Ht 63.0 in | Wt 119.0 lb

## 2022-08-05 DIAGNOSIS — Z0000 Encounter for general adult medical examination without abnormal findings: Secondary | ICD-10-CM

## 2022-08-05 NOTE — Progress Notes (Signed)
I connected with Pamela Mathews today by telephone and verified that I am speaking with the correct person using two identifiers. Location patient: home Location provider: work Persons participating in the virtual visit: Pamela Mathews, Elisha Ponder LPN.   I discussed the limitations, risks, security and privacy concerns of performing an evaluation and management service by telephone and the availability of in person appointments. I also discussed with the patient that there may be a patient responsible charge related to this service. The patient expressed understanding and verbally consented to this telephonic visit.    Interactive audio and video telecommunications were attempted between this provider and patient, however failed, due to patient having technical difficulties OR patient did not have access to video capability.  We continued and completed visit with audio only.     Vital signs may be patient reported or missing.  Subjective:   Pamela Mathews is a 76 y.o. female who presents for Medicare Annual (Subsequent) preventive examination.  Review of Systems     Cardiac Risk Factors include: advanced age (>67men, >20 women);dyslipidemia;hypertension     Objective:    Today's Vitals   08/05/22 1356  Weight: 119 lb (54 kg)  Height: 5\' 3"  (1.6 m)   Body mass index is 21.08 kg/m.     08/05/2022    2:02 PM 07/23/2021    3:15 PM 07/09/2020   10:19 AM 07/03/2019   11:12 AM 09/08/2018   10:16 AM  Advanced Directives  Does Patient Have a Medical Advance Directive? No No No No No  Would patient like information on creating a medical advance directive?   No - Patient declined  Yes (MAU/Ambulatory/Procedural Areas - Information given)    Current Medications (verified) Outpatient Encounter Medications as of 08/05/2022  Medication Sig   folic acid (FOLVITE) 1 MG tablet Take 1 mg by mouth daily.   hydrochlorothiazide (HYDRODIURIL) 12.5 MG tablet Take 1 tablet by mouth daily    methotrexate (RHEUMATREX) 2.5 MG tablet Take 2.5 mg by mouth once a week. Caution:Chemotherapy. Protect from light.   metoprolol succinate (TOPROL-XL) 25 MG 24 hr tablet TAKE 1 TABLET(25 MG) BY MOUTH DAILY   VITAMIN D PO Take 1 tablet by mouth daily.   albuterol (VENTOLIN HFA) 108 (90 Base) MCG/ACT inhaler Inhale 2 puffs into the lungs every 6 (six) hours as needed for wheezing or shortness of breath. (Patient not taking: Reported on 08/05/2022)   No facility-administered encounter medications on file as of 08/05/2022.    Allergies (verified) Patient has no known allergies.   History: Past Medical History:  Diagnosis Date   Cellulitis    Hypertension    Peripheral arterial disease ([facility])    Ulcer    History reviewed. No pertinent surgical history. Family History  Problem Relation Age of Onset   Hypertension Mother    Breast cancer Neg Hx    Social History   Socioeconomic History   Marital status: Divorced    Spouse name: Not on file   Number of children: Not on file   Years of education: Not on file   Highest education level: Not on file  Occupational History   Occupation: retired  Tobacco Use   Smoking status: Former    Types: Cigarettes    Quit date: 08/2017    Years since quitting: 4.9   Smokeless tobacco: Never  Vaping Use   Vaping Use: Never used  Substance and Sexual Activity   Alcohol use: Not Currently   Drug use: Never  Sexual activity: Not Currently  Other Topics Concern   Not on file  Social History Narrative   Not on file   Social Determinants of Health   Financial Resource Strain: Low Risk  (08/05/2022)   Overall Financial Resource Strain (CARDIA)    Difficulty of Paying Living Expenses: Not hard at all  Food Insecurity: No Food Insecurity (08/05/2022)   Hunger Vital Sign    Worried About Running Out of Food in the Last Year: Never true    Ran Out of Food in the Last Year: Never true  Transportation Needs: No Transportation Needs (08/05/2022)    PRAPARE - Administrator, Civil Service (Medical): No    Lack of Transportation (Non-Medical): No  Physical Activity: Inactive (08/05/2022)   Exercise Vital Sign    Days of Exercise per Week: 0 days    Minutes of Exercise per Session: 0 min  Stress: No Stress Concern Present (08/05/2022)   [employer] of Occupational Health - Occupational Stress Questionnaire    Feeling of Stress : Not at all  Social Connections: Not on file    Tobacco Counseling Counseling given: Not Answered   Clinical Intake:  Pre-visit preparation completed: Yes  Pain : No/denies pain     Nutritional Status: BMI of 19-24  Normal Nutritional Risks: None Diabetes: No  How often do you need to have someone help you when you read instructions, pamphlets, or other written materials from your doctor or pharmacy?: 1 - Never What is the last grade level you completed in school?: 12th grade  Diabetic? no  Interpreter Needed?: No  Information entered by :: NAllen LPN   Activities of Daily Living    08/05/2022    2:04 PM  In your present state of health, do you have any difficulty performing the following activities:  Hearing? 0  Vision? 0  Difficulty concentrating or making decisions? 0  Walking or climbing stairs? 0  Dressing or bathing? 0  Doing errands, shopping? 0  Preparing Food and eating ? N  Using the Toilet? N  In the past six months, have you accidently leaked urine? N  Do you have problems with loss of bowel control? N  Managing your Medications? N  Managing your Finances? N  Housekeeping or managing your Housekeeping? N    Patient Care Team: Arnette Felts, FNP as PCP - General (General Practice)  Indicate any recent Medical Services you may have received from other than [facility] providers in the past year (date may be approximate).     Assessment:   This is a routine wellness examination for [place].  Hearing/Vision screen Vision Screening - Comments:: Regular  eye exams, [facility]  Dietary issues and exercise activities discussed: Current Exercise Habits: The patient does not participate in regular exercise at present   Goals Addressed             This Visit's Progress    Patient Stated       08/05/2022, no goals       Depression Screen    08/05/2022    2:04 PM 07/20/2022    2:13 PM 07/23/2021    3:16 PM 07/09/2020   10:20 AM 10/08/2019    9:32 AM 07/03/2019   11:12 AM 07/03/2019   10:34 AM  PHQ 2/9 Scores  PHQ - 2 Score 0 0 0 0 0 0 0    Fall Risk    08/05/2022    2:03 PM 07/20/2022    2:13 PM 01/18/2022  4:32 PM 07/23/2021    3:16 PM 07/09/2020   10:19 AM  Fall Risk   Falls in the past year? 1 0 0 0 1  Comment slid on a toy    slipped on a toy  Number falls in past yr: 0 0 0  0  Injury with Fall? 0 0 0  1  Comment     fractured knee  Risk for fall due to : Medication side effect No Fall Risks  Impaired balance/gait;Impaired mobility;Medication side effect Impaired balance/gait;Impaired mobility;History of fall(s);Medication side effect  Follow up Falls evaluation completed;Education provided;Falls prevention discussed Falls evaluation completed  Falls evaluation completed;Education provided;Falls prevention discussed Falls evaluation completed;Education provided;Falls prevention discussed    FALL RISK PREVENTION PERTAINING TO THE HOME:  Any stairs in or around the home? No  If so, are there any without handrails? N/a Home free of loose throw rugs in walkways, pet beds, electrical cords, etc? Yes  Adequate lighting in your home to reduce risk of falls? Yes   ASSISTIVE DEVICES UTILIZED TO PREVENT FALLS:  Life alert? No  Use of a cane, walker or w/c? Yes  Grab bars in the bathroom? No  Shower chair or bench in shower? No  Elevated toilet seat or a handicapped toilet? No   TIMED UP AND GO:  Was the test performed? No .      Cognitive Function:        08/05/2022    2:05 PM 07/23/2021    3:18 PM 07/09/2020    10:21 AM 07/03/2019   11:13 AM  6CIT Screen  What Year? 4 points 0 points 0 points 0 points  What month? 0 points 0 points 0 points 0 points  What time? 0 points 0 points 0 points 0 points  Count back from 20 0 points 0 points 0 points 0 points  Months in reverse 0 points 2 points 0 points 2 points  Repeat phrase 4 points 0 points 2 points 0 points  Total Score 8 points 2 points 2 points 2 points    Immunizations  There is no immunization history on file for this patient.  TDAP status: Due, Education has been provided regarding the importance of this vaccine. Advised may receive this vaccine at local pharmacy or Health Dept. Aware to provide a copy of the vaccination record if obtained from local pharmacy or Health Dept. Verbalized acceptance and understanding.  Flu Vaccine status: Declined, Education has been provided regarding the importance of this vaccine but patient still declined. Advised may receive this vaccine at local pharmacy or Health Dept. Aware to provide a copy of the vaccination record if obtained from local pharmacy or Health Dept. Verbalized acceptance and understanding.  Pneumococcal vaccine status: Declined,  Education has been provided regarding the importance of this vaccine but patient still declined. Advised may receive this vaccine at local pharmacy or Health Dept. Aware to provide a copy of the vaccination record if obtained from local pharmacy or Health Dept. Verbalized acceptance and understanding.   Covid-19 vaccine status: Declined, Education has been provided regarding the importance of this vaccine but patient still declined. Advised may receive this vaccine at local pharmacy or Health Dept.or vaccine clinic. Aware to provide a copy of the vaccination record if obtained from local pharmacy or Health Dept. Verbalized acceptance and understanding.  Qualifies for Shingles Vaccine? Yes   Zostavax completed No   Shingrix Completed?: No.    Education has been  provided regarding the importance of this vaccine.  Patient has been advised to call insurance company to determine out of pocket expense if they have not yet received this vaccine. Advised may also receive vaccine at local pharmacy or Health Dept. Verbalized acceptance and understanding.  Screening Tests Health Maintenance  Topic Date Due   COVID-19 Vaccine (1) 08/21/2022 (Originally 01/15/1951)   Zoster Vaccines- Shingrix (1 of 2) 10/20/2022 (Originally 01/14/1965)   INFLUENZA VACCINE  01/16/2023 (Originally 05/18/2022)   Pneumonia Vaccine 72+ Years old (1 - PCV) 01/19/2023 (Originally 01/15/2011)   TETANUS/TDAP  07/21/2023 (Originally 01/14/1965)   DEXA SCAN  Completed   Hepatitis C Screening  Completed   HPV VACCINES  Aged Out    Health Maintenance  There are no preventive care reminders to display for this patient.   Colorectal cancer screening: No longer required.   Mammogram status: No longer required due to age.  Bone Density status: Completed 01/10/2020.   Lung Cancer Screening: (Low Dose CT Chest recommended if Age 21-80 years, 30 pack-year currently smoking OR have quit w/in 15years.) does not qualify.   Lung Cancer Screening Referral: no  Additional Screening:  Hepatitis C Screening: does qualify; Completed 12/08/2018  Vision Screening: Recommended annual ophthalmology exams for early detection of glaucoma and other disorders of the eye. Is the patient up to date with their annual eye exam?  Yes  Who is the provider or what is the name of the office in which the patient attends annual eye exams? [facility] If pt is not established with a provider, would they like to be referred to a provider to establish care? No .   Dental Screening: Recommended annual dental exams for proper oral hygiene  Community Resource Referral / Chronic Care Management: CRR required this visit?  No   CCM required this visit?  No      Plan:     I have personally reviewed and noted the  following in the patient's chart:   Medical and social history Use of alcohol, tobacco or illicit drugs  Current medications and supplements including opioid prescriptions. Patient is not currently taking opioid prescriptions. Functional ability and status Nutritional status Physical activity Advanced directives List of other physicians Hospitalizations, surgeries, and ER visits in previous 12 months Vitals Screenings to include cognitive, depression, and falls Referrals and appointments  In addition, I have reviewed and discussed with patient certain preventive protocols, quality metrics, and best practice recommendations. A written personalized care plan for preventive services as well as general preventive health recommendations were provided to patient.     Barb Merino, LPN   [id number]/[id number]   Nurse Notes: none  Due to this being a virtual visit, the after visit summary with patients personalized plan was offered to patient via mail or my-chart.  to pick up at office at next visit

## 2022-08-05 NOTE — Patient Instructions (Signed)
Pamela Mathews , Thank you for taking time to come for your Medicare Wellness Visit. I appreciate your ongoing commitment to your health goals. Please review the following plan we discussed and let me know if I can assist you in the future.   Screening recommendations/referrals: Colonoscopy: not required Mammogram: not required Bone Density: completed 01/10/2020 Recommended yearly ophthalmology/optometry visit for glaucoma screening and checkup Recommended yearly dental visit for hygiene and checkup  Vaccinations: Influenza vaccine: decline Pneumococcal vaccine: decline Tdap vaccine: decline Shingles vaccine: decline   Covid-19: decline  Advanced directives: Advance directive discussed with you today.   Conditions/risks identified: none  Next appointment: Follow up in one year for your annual wellness visit    Preventive Care 76 Years and Older, Female Preventive care refers to lifestyle choices and visits with your health care provider that can promote health and wellness. What does preventive care include? A yearly physical exam. This is also called an annual well check. Dental exams once or twice a year. Routine eye exams. Ask your health care provider how often you should have your eyes checked. Personal lifestyle choices, including: Daily care of your teeth and gums. Regular physical activity. Eating a healthy diet. Avoiding tobacco and drug use. Limiting alcohol use. Practicing safe sex. Taking low-dose aspirin every day. Taking vitamin and mineral supplements as recommended by your health care provider. What happens during an annual well check? The services and screenings done by your health care provider during your annual well check will depend on your age, overall health, lifestyle risk factors, and family history of disease. Counseling  Your health care provider may ask you questions about your: Alcohol use. Tobacco use. Drug use. Emotional well-being. Home and  relationship well-being. Sexual activity. Eating habits. History of falls. Memory and ability to understand (cognition). Work and work Statistician. Reproductive health. Screening  You may have the following tests or measurements: Height, weight, and BMI. Blood pressure. Lipid and cholesterol levels. These may be checked every 5 years, or more frequently if you are over 14 years old. Skin check. Lung cancer screening. You may have this screening every year starting at age 50 if you have a 30-pack-year history of smoking and currently smoke or have quit within the past 15 years. Fecal occult blood test (FOBT) of the stool. You may have this test every year starting at age 70. Flexible sigmoidoscopy or colonoscopy. You may have a sigmoidoscopy every 5 years or a colonoscopy every 10 years starting at age 51. Hepatitis C blood test. Hepatitis B blood test. Sexually transmitted disease (STD) testing. Diabetes screening. This is done by checking your blood sugar (glucose) after you have not eaten for a while (fasting). You may have this done every 1-3 years. Bone density scan. This is done to screen for osteoporosis. You may have this done starting at age 57. Mammogram. This may be done every 1-2 years. Talk to your health care provider about how often you should have regular mammograms. Talk with your health care provider about your test results, treatment options, and if necessary, the need for more tests. Vaccines  Your health care provider may recommend certain vaccines, such as: Influenza vaccine. This is recommended every year. Tetanus, diphtheria, and acellular pertussis (Tdap, Td) vaccine. You may need a Td booster every 10 years. Zoster vaccine. You may need this after age 46. Pneumococcal 13-valent conjugate (PCV13) vaccine. One dose is recommended after age 37. Pneumococcal polysaccharide (PPSV23) vaccine. One dose is recommended after age 22. Talk to  your health care provider  about which screenings and vaccines you need and how often you need them. This information is not intended to replace advice given to you by your health care provider. Make sure you discuss any questions you have with your health care provider. Document Released: 10/31/2015 Document Revised: 06/23/2016 Document Reviewed: 08/05/2015 Elsevier Interactive Patient Education  2017 [place] Prevention in the Home Falls can cause injuries. They can happen to people of all ages. There are many things you can do to make your home safe and to help prevent falls. What can I do on the outside of my home? Regularly fix the edges of walkways and driveways and fix any cracks. Remove anything that might make you trip as you walk through a door, such as a raised step or threshold. Trim any bushes or trees on the path to your home. Use bright outdoor lighting. Clear any walking paths of anything that might make someone trip, such as rocks or tools. Regularly check to see if handrails are loose or broken. Make sure that both sides of any steps have handrails. Any raised decks and porches should have guardrails on the edges. Have any leaves, snow, or ice cleared regularly. Use sand or salt on walking paths during winter. Clean up any spills in your garage right away. This includes oil or grease spills. What can I do in the bathroom? Use night lights. Install grab bars by the toilet and in the tub and shower. Do not use towel bars as grab bars. Use non-skid mats or decals in the tub or shower. If you need to sit down in the shower, use a plastic, non-slip stool. Keep the floor dry. Clean up any water that spills on the floor as soon as it happens. Remove soap buildup in the tub or shower regularly. Attach bath mats securely with double-sided non-slip rug tape. Do not have throw rugs and other things on the floor that can make you trip. What can I do in the bedroom? Use night lights. Make sure  that you have a light by your bed that is easy to reach. Do not use any sheets or blankets that are too big for your bed. They should not hang down onto the floor. Have a firm chair that has side arms. You can use this for support while you get dressed. Do not have throw rugs and other things on the floor that can make you trip. What can I do in the kitchen? Clean up any spills right away. Avoid walking on wet floors. Keep items that you use a lot in easy-to-reach places. If you need to reach something above you, use a strong step stool that has a grab bar. Keep electrical cords out of the way. Do not use floor polish or wax that makes floors slippery. If you must use wax, use non-skid floor wax. Do not have throw rugs and other things on the floor that can make you trip. What can I do with my stairs? Do not leave any items on the stairs. Make sure that there are handrails on both sides of the stairs and use them. Fix handrails that are broken or loose. Make sure that handrails are as long as the stairways. Check any carpeting to make sure that it is firmly attached to the stairs. Fix any carpet that is loose or worn. Avoid having throw rugs at the top or bottom of the stairs. If you do have throw rugs, attach  them to the floor with carpet tape. Make sure that you have a light switch at the top of the stairs and the bottom of the stairs. If you do not have them, ask someone to add them for you. What else can I do to help prevent falls? Wear shoes that: Do not have high heels. Have rubber bottoms. Are comfortable and fit you well. Are closed at the toe. Do not wear sandals. If you use a stepladder: Make sure that it is fully opened. Do not climb a closed stepladder. Make sure that both sides of the stepladder are locked into place. Ask someone to hold it for you, if possible. Clearly mark and make sure that you can see: Any grab bars or handrails. First and last steps. Where the edge of  each step is. Use tools that help you move around (mobility aids) if they are needed. These include: Canes. Walkers. Scooters. Crutches. Turn on the lights when you go into a dark area. Replace any light bulbs as soon as they burn out. Set up your furniture so you have a clear path. Avoid moving your furniture around. If any of your floors are uneven, fix them. If there are any pets around you, be aware of where they are. Review your medicines with your doctor. Some medicines can make you feel dizzy. This can increase your chance of falling. Ask your doctor what other things that you can do to help prevent falls. This information is not intended to replace advice given to you by your health care provider. Make sure you discuss any questions you have with your health care provider. Document Released: 07/31/2009 Document Revised: 03/11/2016 Document Reviewed: 11/08/2014 Elsevier Interactive Patient Education  2017 [employer].

## 2022-10-15 DIAGNOSIS — R700 Elevated erythrocyte sedimentation rate: Secondary | ICD-10-CM | POA: Diagnosis not present

## 2022-10-15 DIAGNOSIS — M0589 Other rheumatoid arthritis with rheumatoid factor of multiple sites: Secondary | ICD-10-CM | POA: Diagnosis not present

## 2022-10-15 DIAGNOSIS — M1990 Unspecified osteoarthritis, unspecified site: Secondary | ICD-10-CM | POA: Diagnosis not present

## 2022-10-15 DIAGNOSIS — Z79899 Other long term (current) drug therapy: Secondary | ICD-10-CM | POA: Diagnosis not present

## 2022-10-15 DIAGNOSIS — M79644 Pain in right finger(s): Secondary | ICD-10-CM | POA: Diagnosis not present

## 2022-10-15 DIAGNOSIS — M810 Age-related osteoporosis without current pathological fracture: Secondary | ICD-10-CM | POA: Diagnosis not present

## 2022-10-28 ENCOUNTER — Other Ambulatory Visit: Payer: Self-pay

## 2022-10-28 DIAGNOSIS — I10 Essential (primary) hypertension: Secondary | ICD-10-CM

## 2022-10-28 MED ORDER — METOPROLOL SUCCINATE ER 25 MG PO TB24: ORAL_TABLET | ORAL | 1 refills | Status: AC

## 2023-02-18 DIAGNOSIS — M79644 Pain in right finger(s): Secondary | ICD-10-CM | POA: Diagnosis not present

## 2023-02-18 DIAGNOSIS — R700 Elevated erythrocyte sedimentation rate: Secondary | ICD-10-CM | POA: Diagnosis not present

## 2023-02-18 DIAGNOSIS — M810 Age-related osteoporosis without current pathological fracture: Secondary | ICD-10-CM | POA: Diagnosis not present

## 2023-02-18 DIAGNOSIS — Z79899 Other long term (current) drug therapy: Secondary | ICD-10-CM | POA: Diagnosis not present

## 2023-02-18 DIAGNOSIS — M1990 Unspecified osteoarthritis, unspecified site: Secondary | ICD-10-CM | POA: Diagnosis not present

## 2023-02-18 DIAGNOSIS — M0589 Other rheumatoid arthritis with rheumatoid factor of multiple sites: Secondary | ICD-10-CM | POA: Diagnosis not present

## 2023-05-25 DIAGNOSIS — M810 Age-related osteoporosis without current pathological fracture: Secondary | ICD-10-CM | POA: Diagnosis not present

## 2023-05-25 DIAGNOSIS — Z79899 Other long term (current) drug therapy: Secondary | ICD-10-CM | POA: Diagnosis not present

## 2023-05-25 DIAGNOSIS — M0589 Other rheumatoid arthritis with rheumatoid factor of multiple sites: Secondary | ICD-10-CM | POA: Diagnosis not present

## 2023-05-25 DIAGNOSIS — M79644 Pain in right finger(s): Secondary | ICD-10-CM | POA: Diagnosis not present

## 2023-05-25 DIAGNOSIS — M1990 Unspecified osteoarthritis, unspecified site: Secondary | ICD-10-CM | POA: Diagnosis not present

## 2023-05-25 DIAGNOSIS — R700 Elevated erythrocyte sedimentation rate: Secondary | ICD-10-CM | POA: Diagnosis not present

## 2023-07-03 ENCOUNTER — Other Ambulatory Visit: Payer: Self-pay

## 2023-07-03 ENCOUNTER — Emergency Department (HOSPITAL_COMMUNITY)
Admission: EM | Admit: 2023-07-03 | Discharge: 2023-07-03 | Disposition: A | Discharge status: Home / Self Care | Payer: Medicare Other | Attending: Student | Admitting: Student

## 2023-07-03 ENCOUNTER — Encounter (HOSPITAL_COMMUNITY): Payer: Self-pay

## 2023-07-03 ENCOUNTER — Emergency Department (HOSPITAL_COMMUNITY): Payer: Medicare Other

## 2023-07-03 DIAGNOSIS — S0993XA Unspecified injury of face, initial encounter: Secondary | ICD-10-CM | POA: Diagnosis not present

## 2023-07-03 DIAGNOSIS — I10 Essential (primary) hypertension: Secondary | ICD-10-CM | POA: Diagnosis not present

## 2023-07-03 DIAGNOSIS — Z87891 Personal history of nicotine dependence: Secondary | ICD-10-CM | POA: Insufficient documentation

## 2023-07-03 DIAGNOSIS — W19XXXA Unspecified fall, initial encounter: Secondary | ICD-10-CM | POA: Insufficient documentation

## 2023-07-03 DIAGNOSIS — Z79899 Other long term (current) drug therapy: Secondary | ICD-10-CM | POA: Diagnosis not present

## 2023-07-03 DIAGNOSIS — S0990XA Unspecified injury of head, initial encounter: Secondary | ICD-10-CM | POA: Diagnosis not present

## 2023-07-03 DIAGNOSIS — S0083XA Contusion of other part of head, initial encounter: Secondary | ICD-10-CM | POA: Insufficient documentation

## 2023-07-03 DIAGNOSIS — Z043 Encounter for examination and observation following other accident: Secondary | ICD-10-CM | POA: Diagnosis not present

## 2023-07-03 LAB — CBC
HCT: 36.8 % (ref 36.0–46.0)
Hemoglobin: 10.9 g/dL — ABNORMAL LOW (ref 12.0–15.0)
MCH: 23.7 pg — ABNORMAL LOW (ref 26.0–34.0)
MCHC: 29.6 g/dL — ABNORMAL LOW (ref 30.0–36.0)
MCV: 80 fL (ref 80.0–100.0)
Platelets: 344 10*3/uL (ref 150–400)
RBC: 4.6 MIL/uL (ref 3.87–5.11)
RDW: 19.5 % — ABNORMAL HIGH (ref 11.5–15.5)
WBC: 10 10*3/uL (ref 4.0–10.5)
nRBC: 0 % (ref 0.0–0.2)

## 2023-07-03 LAB — COMPREHENSIVE METABOLIC PANEL
ALT: 17 U/L (ref 0–44)
AST: 44 U/L — ABNORMAL HIGH (ref 15–41)
Albumin: 3.4 g/dL — ABNORMAL LOW (ref 3.5–5.0)
Alkaline Phosphatase: 92 U/L (ref 38–126)
Anion gap: 14 (ref 5–15)
BUN: 18 mg/dL (ref 8–23)
CO2: 22 mmol/L (ref 22–32)
Calcium: 9.1 mg/dL (ref 8.9–10.3)
Chloride: 97 mmol/L — ABNORMAL LOW (ref 98–111)
Creatinine, Ser: 0.57 mg/dL (ref 0.44–1.00)
GFR, Estimated: >60 mL/min (ref >60)
Glucose, Bld: 89 mg/dL (ref 70–99)
Potassium: 3.6 mmol/L (ref 3.5–5.1)
Sodium: 133 mmol/L — ABNORMAL LOW (ref 135–145)
Total Bilirubin: 1 mg/dL (ref 0.3–1.2)
Total Protein: 8.1 g/dL (ref 6.5–8.1)

## 2023-07-03 NOTE — ED Provider Notes (Signed)
[place] EMERGENCY DEPARTMENT AT [facility] Provider Note  CSN: [id number] Arrival date & time: 07/03/23 1121  Chief Complaint(s) Fall and Altered Mental Status  HPI Pamela Mathews is a 77 y.o. female who presents emergency department for evaluation of a fall.  Obtained from patient's family who states that the patient was last seen at 8 PM last night.  Was found on the ground this morning.  States that patient has had intermittent difficulty with remembering names and memory difficulties.  Patient arrives with bruising to the face but currently denies chest pain, shortness of breath, abdominal pain, nausea, vomiting or other systemic symptoms.  She is able to answer questions appropriately and is alert and oriented x 3.  Family states they are primarily concerned about a stroke.  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 Authorizing Provider  albuterol (VENTOLIN HFA) 108 (90 Base) MCG/ACT inhaler Inhale 2 puffs into the lungs every 6 (six) hours as needed for wheezing or shortness of breath. Patient not taking: Reported on 08/05/2022 07/20/22   Arnette Felts, FNP  folic acid (FOLVITE) 1 MG tablet Take 1 mg by mouth daily.    [provider]  hydrochlorothiazide (HYDRODIURIL) 12.5 MG tablet Take 1 tablet by mouth daily 01/18/22   Arnette Felts, FNP  methotrexate (RHEUMATREX) 2.5 MG tablet Take 2.5 mg by mouth once a week. Caution:Chemotherapy. Protect from light.    [provider]   metoprolol succinate (TOPROL-XL) 25 MG 24 hr tablet TAKE 1 TABLET(25 MG) BY MOUTH DAILY 10/28/22   Arnette Felts, FNP  VITAMIN D PO Take 1 tablet by mouth daily.    [provider]                                                                                                                                    Past Surgical History History reviewed. No pertinent surgical history. Family History Family History  Problem Relation Age of Onset   Hypertension Mother    Breast cancer Neg Hx     Social History Social History   Tobacco Use   Smoking status: Former    Current packs/day: 0.00    Types: Cigarettes    Quit date: 08/2017    Years since quitting: 5.8   Smokeless tobacco: Never  Vaping Use   Vaping status: Never Used  Substance Use Topics   Alcohol use: Not Currently   Drug use: Never   Allergies  Patient has no known allergies.  Review of Systems Review of Systems  All other systems reviewed and are negative.   Physical Exam Vital Signs  I have reviewed the triage vital signs BP 121/82   Pulse (!) 107   Temp 97.9 F (36.6 C) (Axillary)   SpO2 95%   Physical Exam Vitals and nursing note reviewed.  Constitutional:      General: She is not in acute distress.    Appearance: She is well-developed.  HENT:     Head: Normocephalic.     Comments: Bruising to the face Eyes:     Conjunctiva/sclera: Conjunctivae normal.  Cardiovascular:     Rate and Rhythm: Normal rate and regular rhythm.     Heart sounds: No murmur heard. Pulmonary:     Effort: Pulmonary effort is normal. No respiratory distress.     Breath sounds: Normal breath sounds.  Abdominal:     Palpations: Abdomen is soft.     Tenderness: There is no abdominal tenderness.  Musculoskeletal:        General: No swelling.     Cervical back: Neck supple.  Skin:    General: Skin is warm and dry.     Capillary Refill: Capillary refill takes less than 2 seconds.  Neurological:     Mental Status:  She is alert.  Psychiatric:        Mood and Affect: Mood normal.     ED Results and Treatments Labs (all labs ordered are listed, but only abnormal results are displayed) Labs Reviewed  COMPREHENSIVE METABOLIC PANEL - Abnormal; Notable for the following components:      Result Value   Sodium 133 (*)    Chloride 97 (*)    Albumin 3.4 (*)    AST 44 (*)    All other components within normal limits  CBC - Abnormal; Notable for the following components:   Hemoglobin 10.9 (*)    MCH 23.7 (*)    MCHC 29.6 (*)    RDW 19.5 (*)    All other components within normal limits  CBG MONITORING, ED                                                                                                                          Radiology CT Head Wo Contrast  Result Date: 07/03/2023 CLINICAL DATA:  Trauma pain EXAM: CT HEAD WITHOUT CONTRAST CT MAXILLOFACIAL WITHOUT CONTRAST TECHNIQUE: Multidetector CT imaging of the head and maxillofacial structures were performed using the standard protocol without intravenous contrast. Multiplanar CT image reconstructions of the maxillofacial structures were also generated. RADIATION DOSE REDUCTION: This exam was performed according to the departmental dose-optimization program which includes automated exposure control, adjustment of the mA and/or kV according to patient size and/or use of iterative reconstruction technique. COMPARISON:  None Available. FINDINGS: CT HEAD FINDINGS Brain: No evidence of acute infarction, hemorrhage, hydrocephalus, extra-axial collection or mass lesion/mass effect. Vascular: No hyperdense vessel or unexpected calcification. CT FACIAL BONES  FINDINGS Skull: Normal. Negative for fracture or focal lesion. Facial bones: No displaced fractures or dislocations. Sinuses/Orbits: No acute finding. Other: Patient is edentulous. IMPRESSION: 1. No acute intracranial pathology. 2. No displaced fractures or dislocations of the facial bones. Electronically Signed    By: Jearld Lesch M.D.   On: 07/03/2023 17:07   CT Maxillofacial Wo Contrast  Result Date: 07/03/2023 CLINICAL DATA:  Trauma pain EXAM: CT HEAD WITHOUT CONTRAST CT MAXILLOFACIAL WITHOUT CONTRAST TECHNIQUE: Multidetector CT imaging of the head and maxillofacial structures were performed using the standard protocol without intravenous contrast. Multiplanar CT image reconstructions of the maxillofacial structures were also generated. RADIATION DOSE REDUCTION: This exam was performed according to the departmental dose-optimization program which includes automated exposure control, adjustment of the mA and/or kV according to patient size and/or use of iterative reconstruction technique. COMPARISON:  None Available. FINDINGS: CT HEAD FINDINGS Brain: No evidence of acute infarction, hemorrhage, hydrocephalus, extra-axial collection or mass lesion/mass effect. Vascular: No hyperdense vessel or unexpected calcification. CT FACIAL BONES FINDINGS Skull: Normal. Negative for fracture or focal lesion. Facial bones: No displaced fractures or dislocations. Sinuses/Orbits: No acute finding. Other: Patient is edentulous. IMPRESSION: 1. No acute intracranial pathology. 2. No displaced fractures or dislocations of the facial bones. Electronically Signed   By: Jearld Lesch M.D.   On: 07/03/2023 17:07    Pertinent labs & imaging results that were available during my care of the patient were reviewed by me and considered in my medical decision making (see MDM for details).  Medications Ordered in ED Medications - No data to display                                                                                                                                   Procedures Procedures  (including critical care time)  Medical Decision Making / ED Course   This patient presents to the ED for concern of fall, this involves an extensive number of treatment options, and is a complaint that carries with it a high risk of  complications and morbidity.  The differential diagnosis includes closed head injury, ICH, fracture, contusion, hematoma, electrolyte abnormality, dehydration  MDM: Seen emergency room for evaluation of a fall.  Physical exam with some superficial abrasions to the face but is otherwise unremarkable.  No focal motor or sensory deficits.  No cranial nerve deficits.  No external signs of trauma over the chest abdomen or pelvis.  Laboratory evaluation with a hemoglobin of 10.9 sodium 133, albumin 3.4, AST 44 but is otherwise unremarkable.  CT head and max face is reassuringly negative for acute traumatic injury.  Patient able to ambulate without difficulty here in the emergency department.  Very low suspicion for stroke today given normal neurologic exam.  With negative workup she currently does not meet inpatient criteria for admission and she is safe for discharge with outpatient follow-up.   Additional history obtained: -Additional history obtained from  daughters -External records from outside source obtained and reviewed including: Chart review including previous notes, labs, imaging, consultation notes   Lab Tests: -I ordered, reviewed, and interpreted labs.   The pertinent results include:   Labs Reviewed  COMPREHENSIVE METABOLIC PANEL - Abnormal; Notable for the following components:      Result Value   Sodium 133 (*)    Chloride 97 (*)    Albumin 3.4 (*)    AST 44 (*)    All other components within normal limits  CBC - Abnormal; Notable for the following components:   Hemoglobin 10.9 (*)    MCH 23.7 (*)    MCHC 29.6 (*)    RDW 19.5 (*)    All other components within normal limits  CBG MONITORING, ED     Imaging Studies ordered: I ordered imaging studies including CT head, max face I independently visualized and interpreted imaging. I agree with the radiologist interpretation   Medicines ordered and prescription drug management: No orders of the defined types were placed in  this encounter.   -I have reviewed the patients home medicines and have made adjustments as needed  Critical interventions none   Cardiac Monitoring: The patient was maintained on a cardiac monitor.  I personally viewed and interpreted the cardiac monitored which showed an underlying rhythm of: NSR  Social Determinants of Health:  Factors impacting patients care include: none   Reevaluation: After the interventions noted above, I reevaluated the patient and found that they have :improved  Co morbidities that complicate the patient evaluation  Past Medical History:  Diagnosis Date   Cellulitis    Hypertension    Peripheral arterial disease (HCC)    Ulcer       Dispostion: I considered admission for this patient, but at this time with negative trauma workup she does not meet inpatient criteria for admission and she is safe for discharge with outpatient follow-up.     Final Clinical Impression(s) / ED Diagnoses Final diagnoses:  Fall, initial encounter     @PCDICTATION @    Kc Sedlak, Wyn Forster, MD 07/03/23 2151

## 2023-07-03 NOTE — ED Triage Notes (Signed)
Pt arrived POV with granddaughter. Reports fall last night, unwitnessed at 8pm. Unsure of head injury or LOC. Granddaughter states "Saturday words started to jumble". Patient presents with AMS. States this is normal however a little worse than normal today. Denies numbness, tingling, shob, cp.

## 2023-08-03 ENCOUNTER — Telehealth: Payer: Self-pay

## 2023-08-03 NOTE — Telephone Encounter (Signed)
Transition Care Management Unsuccessful Follow-up Telephone Call  Date of discharge and from where:  [facility] 9/15  Attempts:  1st Attempt  Reason for unsuccessful TCM follow-up call:  No answer/busy   Lenard Forth   [facility], [facility], Phone: [phone] Website: [email or web address]

## 2023-08-05 ENCOUNTER — Telehealth: Payer: Self-pay

## 2023-08-05 NOTE — Telephone Encounter (Signed)
Transition Care Management Follow-up Telephone Call Date of discharge and from where: Pamela Mathews 9/15 How have you been since you were released from the hospital? Doing well and has followed up with PCP Any questions or concerns? No  Items Reviewed: Did the pt receive and understand the discharge instructions provided? Yes  Medications obtained and verified?Yes Other? No  Any new allergies since your discharge? No  Dietary orders reviewed? No Do you have support at home? No     Follow up appointments reviewed:  PCP Hospital f/u appt confirmed? Yes  Scheduled to see  on  @ . Specialist Hospital f/u appt confirmed? No  Scheduled to see  on  @ . Are transportation arrangements needed? No  If their condition worsens, is the pt aware to call PCP or go to the Emergency Dept.? Yes Was the patient provided with contact information for the PCP's office or ED? Yes Was to pt encouraged to call back with questions or concerns? Yes

## 2023-08-23 NOTE — Progress Notes (Deleted)
Madelaine Bhat, CMA,acting as a Neurosurgeon for Arnette Felts, FNP.,have documented all relevant documentation on the behalf of Arnette Felts, FNP,as directed by  Arnette Felts, FNP while in the presence of Arnette Felts, FNP.  Subjective:  Patient ID: Pamela Mathews , female    DOB: 1946/06/06 , 77 y.o.   MRN: [id number]  No chief complaint on file.   HPI  Patient presents today for a bp and chol follow up, Patient reports compliance with medication. Patient denies any chest pain, SOB, or headaches. Patient has no concerns today.     Past Medical History:  Diagnosis Date  . Cellulitis   . Hypertension   . Peripheral arterial disease (HCC)   . Ulcer      Family History  Problem Relation Age of Onset  . Hypertension Mother   . Breast cancer Neg Hx      Current Outpatient Medications:  .  albuterol (VENTOLIN HFA) 108 (90 Base) MCG/ACT inhaler, Inhale 2 puffs into the lungs every 6 (six) hours as needed for wheezing or shortness of breath. (Patient not taking: Reported on 08/05/2022), Disp: 8 g, Rfl: 2 .  folic acid (FOLVITE) 1 MG tablet, Take 1 mg by mouth daily., Disp: , Rfl:  .  hydrochlorothiazide (HYDRODIURIL) 12.5 MG tablet, Take 1 tablet by mouth daily, Disp: 90 tablet, Rfl: 1 .  methotrexate (RHEUMATREX) 2.5 MG tablet, Take 2.5 mg by mouth once a week. Caution:Chemotherapy. Protect from light., Disp: , Rfl:  .  metoprolol succinate (TOPROL-XL) 25 MG 24 hr tablet, TAKE 1 TABLET(25 MG) BY MOUTH DAILY, Disp: 90 tablet, Rfl: 1 .  VITAMIN D PO, Take 1 tablet by mouth daily., Disp: , Rfl:    No Known Allergies   Review of Systems  Constitutional: Negative.   HENT: Negative.    Eyes: Negative.   Respiratory: Negative.    Cardiovascular: Negative.   Gastrointestinal: Negative.     There were no vitals filed for this visit. There is no height or weight on file to calculate BMI.  Wt Readings from Last 3 Encounters:  08/05/22 119 lb (54 kg)  01/18/22 108 lb (49 kg)  07/23/21  103 lb 12.8 oz (47.1 kg)    The 10-year ASCVD risk score (Arnett DK, et al., 2019) is: 19%   Values used to calculate the score:     Age: 80 years     Sex: Female     Is Non-Hispanic African American: Yes     Diabetic: No     Tobacco smoker: No     Systolic Blood Pressure: 121 mmHg     Is BP treated: Yes     HDL Cholesterol: 67 mg/dL     Total Cholesterol: 225 mg/dL  Objective:  Physical Exam      Assessment And Plan:  Benign hypertension without congestive heart failure  Mixed hyperlipidemia    No follow-ups on file.  Patient was given opportunity to ask questions. Patient verbalized understanding of the plan and was able to repeat key elements of the plan. All questions were answered to their satisfaction.    Jeanell Sparrow, FNP, have reviewed all documentation for this visit. The documentation on 08/23/23 for the exam, diagnosis, procedures, and orders are all accurate and complete.   IF YOU HAVE BEEN REFERRED TO A SPECIALIST, IT MAY TAKE 1-2 WEEKS TO SCHEDULE/PROCESS THE REFERRAL. IF YOU HAVE NOT HEARD FROM US/SPECIALIST IN TWO WEEKS, PLEASE GIVE [place] A CALL AT [phone].

## 2023-08-24 ENCOUNTER — Ambulatory Visit: Payer: Medicare Other | Admitting: Nurse Practitioner

## 2023-08-24 DIAGNOSIS — E782 Mixed hyperlipidemia: Secondary | ICD-10-CM

## 2023-08-24 DIAGNOSIS — I10 Essential (primary) hypertension: Secondary | ICD-10-CM

## 2023-08-31 DIAGNOSIS — R700 Elevated erythrocyte sedimentation rate: Secondary | ICD-10-CM | POA: Diagnosis not present

## 2023-08-31 DIAGNOSIS — M810 Age-related osteoporosis without current pathological fracture: Secondary | ICD-10-CM | POA: Diagnosis not present

## 2023-08-31 DIAGNOSIS — M1990 Unspecified osteoarthritis, unspecified site: Secondary | ICD-10-CM | POA: Diagnosis not present

## 2023-08-31 DIAGNOSIS — M79644 Pain in right finger(s): Secondary | ICD-10-CM | POA: Diagnosis not present

## 2023-08-31 DIAGNOSIS — M0589 Other rheumatoid arthritis with rheumatoid factor of multiple sites: Secondary | ICD-10-CM | POA: Diagnosis not present

## 2023-08-31 DIAGNOSIS — Z79899 Other long term (current) drug therapy: Secondary | ICD-10-CM | POA: Diagnosis not present

## 2024-02-23 DIAGNOSIS — Z79899 Other long term (current) drug therapy: Secondary | ICD-10-CM | POA: Diagnosis not present

## 2024-02-23 DIAGNOSIS — M1990 Unspecified osteoarthritis, unspecified site: Secondary | ICD-10-CM | POA: Diagnosis not present

## 2024-02-23 DIAGNOSIS — M79644 Pain in right finger(s): Secondary | ICD-10-CM | POA: Diagnosis not present

## 2024-02-23 DIAGNOSIS — R700 Elevated erythrocyte sedimentation rate: Secondary | ICD-10-CM | POA: Diagnosis not present

## 2024-02-23 DIAGNOSIS — M0589 Other rheumatoid arthritis with rheumatoid factor of multiple sites: Secondary | ICD-10-CM | POA: Diagnosis not present

## 2024-02-23 DIAGNOSIS — M810 Age-related osteoporosis without current pathological fracture: Secondary | ICD-10-CM | POA: Diagnosis not present

## 2024-06-06 DIAGNOSIS — M7989 Other specified soft tissue disorders: Secondary | ICD-10-CM | POA: Diagnosis not present

## 2024-06-06 DIAGNOSIS — M0589 Other rheumatoid arthritis with rheumatoid factor of multiple sites: Secondary | ICD-10-CM | POA: Diagnosis not present

## 2024-06-06 DIAGNOSIS — Z79899 Other long term (current) drug therapy: Secondary | ICD-10-CM | POA: Diagnosis not present

## 2024-06-06 DIAGNOSIS — M810 Age-related osteoporosis without current pathological fracture: Secondary | ICD-10-CM | POA: Diagnosis not present

## 2024-06-06 DIAGNOSIS — R700 Elevated erythrocyte sedimentation rate: Secondary | ICD-10-CM | POA: Diagnosis not present

## 2024-06-06 DIAGNOSIS — M79646 Pain in unspecified finger(s): Secondary | ICD-10-CM | POA: Diagnosis not present

## 2024-06-06 DIAGNOSIS — M1990 Unspecified osteoarthritis, unspecified site: Secondary | ICD-10-CM | POA: Diagnosis not present
